# Patient Record
Sex: Female | Born: 1995 | Hispanic: Yes | Marital: Married | Smoking: Never smoker
Health system: Southern US, Community
[De-identification: ages and names within clinical notes are randomized; demographics above are authoritative.]

## PROBLEM LIST (undated history)

## (undated) DIAGNOSIS — A6000 Herpesviral infection of urogenital system, unspecified: Secondary | ICD-10-CM

## (undated) DIAGNOSIS — G43909 Migraine, unspecified, not intractable, without status migrainosus: Secondary | ICD-10-CM

## (undated) DIAGNOSIS — E282 Polycystic ovarian syndrome: Secondary | ICD-10-CM

## (undated) DIAGNOSIS — B009 Herpesviral infection, unspecified: Secondary | ICD-10-CM

## (undated) DIAGNOSIS — R945 Abnormal results of liver function studies: Secondary | ICD-10-CM

## (undated) DIAGNOSIS — A749 Chlamydial infection, unspecified: Secondary | ICD-10-CM

## (undated) HISTORY — DX: Migraine, unspecified, not intractable, without status migrainosus: G43.909

## (undated) HISTORY — DX: Herpesviral infection of urogenital system, unspecified: A60.00

## (undated) HISTORY — DX: Abnormal results of liver function studies: R94.5

## (undated) HISTORY — DX: Chlamydial infection, unspecified: A74.9

## (undated) HISTORY — DX: Herpesviral infection, unspecified: B00.9

## (undated) HISTORY — DX: Polycystic ovarian syndrome: E28.2

---

## 2015-01-28 ENCOUNTER — Emergency Department (HOSPITAL_BASED_OUTPATIENT_CLINIC_OR_DEPARTMENT_OTHER): Payer: No Typology Code available for payment source

## 2015-01-28 ENCOUNTER — Encounter (HOSPITAL_BASED_OUTPATIENT_CLINIC_OR_DEPARTMENT_OTHER): Payer: Self-pay | Admitting: *Deleted

## 2015-01-28 ENCOUNTER — Emergency Department (HOSPITAL_BASED_OUTPATIENT_CLINIC_OR_DEPARTMENT_OTHER)
Admission: EM | Admit: 2015-01-28 | Discharge: 2015-01-28 | Disposition: A | Discharge status: Home / Self Care | Payer: No Typology Code available for payment source | Attending: Emergency Medicine | Admitting: Emergency Medicine

## 2015-01-28 DIAGNOSIS — S134XXA Sprain of ligaments of cervical spine, initial encounter: Secondary | ICD-10-CM | POA: Insufficient documentation

## 2015-01-28 DIAGNOSIS — Y92410 Unspecified street and highway as the place of occurrence of the external cause: Secondary | ICD-10-CM | POA: Insufficient documentation

## 2015-01-28 DIAGNOSIS — Y9389 Activity, other specified: Secondary | ICD-10-CM | POA: Insufficient documentation

## 2015-01-28 DIAGNOSIS — Y998 Other external cause status: Secondary | ICD-10-CM | POA: Insufficient documentation

## 2015-01-28 DIAGNOSIS — S199XXA Unspecified injury of neck, initial encounter: Secondary | ICD-10-CM | POA: Diagnosis present

## 2015-01-28 MED ORDER — IBUPROFEN 400 MG PO TABS
600.0000 mg | ORAL_TABLET | Freq: Once | ORAL | Status: AC
  Administered 2015-01-28: 600 mg via ORAL
  Filled 2015-01-28: qty 1

## 2015-01-28 MED ORDER — CYCLOBENZAPRINE HCL 10 MG PO TABS: 5.0000 mg | ORAL_TABLET | Freq: Two times a day (BID) | ORAL | Status: DC | PRN

## 2015-01-28 MED ORDER — IBUPROFEN 400 MG PO TABS: 400.0000 mg | ORAL_TABLET | Freq: Four times a day (QID) | ORAL | Status: DC | PRN
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
## 2015-01-28 NOTE — ED Notes (Signed)
Patient transported to X-ray 
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
## 2015-01-28 NOTE — Discharge Instructions (Signed)
Distensión cervical °(Cervical Sprain) °Una distensión cervical es una lesión en el cuello, en la que los tejidos fuertes y fibrosos (ligamentos) que unen los huesos del cuello, se distienden o se rompen. Una distensión cervical puede ser desde muy leve a muy grave. En los casos graves pueden hacer que las vértebras del cuello se vuelvan inestables. Esto puede causar un daño en la médula espinal y puede dar lugar a graves problemas del sistema nervioso. La cantidad de tiempo que demora la mejoría de una distensión cervical depende de la causa y de la extensión de la lesión. La mayoría de las veces se cura en 1 a 3 semanas. °CAUSAS  °Las distensiones graves pueden ser causadas por:  °· Lesiones por deportes de contacto (como en el fútbol americano, rugby, lucha, hockey, automovilismo, gimnasia, buceo, artes marciales y boxeo). °· Colisiones en vehículos de motor. °· Lesiones de latigazo cervical. Esta es una lesión por movimiento brusco de adelante hacia atrás de la cabeza y el cuello. °· Caídas. °La causa de las distensiones cervicales leves pueden ser:  °· Adoptar posiciones incómodas, como sostener el teléfono entre la oreja y el hombro. °· Sentarse en una silla que no ofrece el soporte adecuado. °· Trabajar en una mesa de computadora mal diseñada. °· Las actividades que requieren mirar hacia arriba o hacia abajo durante largos períodos. °SÍNTOMAS  °· Dolor, sensibilidad, rigidez, o sensación de ardor en la parte anterior, posterior o lateral del cuello. Este malestar puede aparecer inmediatamente después de la lesión o puede desarrollarse lentamente y no empezar hasta 24 horas o más después de la lesión. °· Dolor o sensibilidad que se siente directamente en la parte media posterior del cuello. °· Dolor en el hombro o la zona superior de la espalda. °· Capacidad limitada para mover el cuello. °· Dolor de cabeza. °· Mareos. °· Debilidad, entumecimiento u hormigueo en las manos o los brazos. °· Espasmos  musculares. °· Dificultades para tragar o comer. °· Sensibilidad e hinchazón en el cuello. °DIAGNÓSTICO  °La mayoría de las veces, el médico puede diagnosticar este problema mediante la historia clínica y un examen físico. Su médico le preguntará acerca de lesiones previas y problemas conocidos como artritis en el cuello. Podrán tomarle radiografías para determinar si hay otros problemas, como enfermedades en los huesos del cuello. También puede ser necesario realizar otras pruebas, como tomografías computadas o resonancia magnética.  °TRATAMIENTO  °El tratamiento depende de la gravedad de la distensión. Las distensiones leves se pueden tratar con reposo, manteniendo el cuello en su lugar (inmovilización) y usando medicamentos para el dolor. Las distensiones graves deben ser inmediatamente inmovilizadas. Será necesario completar el tratamiento para aliviar el dolor, los espasmos musculares y otros síntomas, y puede incluir. °· Medicamentos como calmantes para el dolor, anestésicos o relajantes musculares. °· Fisioterapia. Esto puede incluir ejercicios de elongación, fortalecimiento y entrenamiento de la postura. Los ejercicios y una mejor postura pueden ayudar a estabilizar el cuello, fortalecer los músculos y evitar que los síntomas vuelvan a aparecer. °INSTRUCCIONES PARA EL CUIDADO EN EL HOGAR  °· Aplique hielo sobre la zona lesionada. °¨ Ponga el hielo en una bolsa plástica. °¨ Colóquese una toalla entre la piel y la bolsa de hielo. °¨ Deje el hielo durante 15 - 20 minutos y aplíquelo 3 - 4 veces por día. °· Si la lesión fue grave, le indicarán el uso de un collarín cervical. El collarín cervical es un collar de dos piezas para impedir que el cuello se mueva mientras se cura. °¨   Nose quite el collarín excepto que se lo indique su médico. °¨ Si tiene el cabello largo, manténgalo fuera del collarín. °¨ Consulte a su médico antes de hacerle ajustes. Los ajustes menores pueden ser requeridos con el tiempo para  mejorar el confort y reducir la presión sobre la barbilla o en la parte posterior de la cabeza. °¨ Si le permiten quitarse el collarín para lavarlo o darse un baño, siga las indicaciones de su médico acerca de cómo hacerlo con seguridad. °¨ Mantenga el collarín limpio pasando un paño con agua y jabón y secándolo bien. Si el collarín tiene almohadillas removibles, quítelas cada 1-2 días para lavarlas a mano con agua y jabón. Deje que se sequen al aire. Debe secarlas bien antes de volver a colocarlas en el collarín. °¨ Si le permiten quitarse el collarín para lavarlo y darse un baño, lave y seque la piel del cuello. Controle su piel para detectar irritación o llagas. Si las tiene, informe a su médico. °¨ No conduzca vehículos mientras usa el collarín. °· Sólo tome medicamentos de venta libre o recetados para calmar el dolor, el malestar o bajar la fiebre, según las indicaciones de su médico. °· Cumpla con todas las visitas de control, según le indique su médico. °· Cumpla con todas las sesiones de fisioterapia, según le indique su médico. °· Haga los ajustes necesarios en su lugar de trabajo para favorecer una buena postura. °· Evite las posiciones y actividades que empeoran los síntomas. °· Haga precalentamiento y elongue antes de comenzar una actividad para evitar problemas. °SOLICITE ATENCIÓN MÉDICA SI:  °· El dolor no se alivia con los medicamentos. °· No puede disminuir la dosis de analgésicos según lo planificado. °· Su nivel de actividad no mejora según lo esperado. °SOLICITE ATENCIÓN MÉDICA DE INMEDIATO SI:  °· Presenta cualquier hemorragia. °· Siente malestar estomacal. °· Tiene signos de reacción alérgica a los medicamentos. °· Los síntomas empeoran. °· Le aparecen síntomas nuevos e inexplicables. °· Siente adormecimiento, hormigueo, debilidad o parálisis en alguna parte del cuerpo. °ASEGÚRESE DE QUE:  °· Comprende estas instrucciones. °· Controlará su afección. °· Recibirá ayuda de inmediato si no mejora o  si empeora. °  °Esta información no tiene como fin reemplazar el consejo del médico. Asegúrese de hacerle al médico cualquier pregunta que tenga. °  °Document Released: 05/11/2008 Document Revised: 12/03/2012 °Elsevier Interactive Patient Education ©2016 Elsevier Inc. ° °

## 2016-07-27 DIAGNOSIS — A749 Chlamydial infection, unspecified: Secondary | ICD-10-CM

## 2016-07-27 HISTORY — DX: Chlamydial infection, unspecified: A74.9

## 2016-08-24 ENCOUNTER — Encounter: Payer: Self-pay | Admitting: Obstetrics and Gynecology

## 2016-08-24 ENCOUNTER — Ambulatory Visit (INDEPENDENT_AMBULATORY_CARE_PROVIDER_SITE_OTHER): Payer: Self-pay | Admitting: Obstetrics and Gynecology

## 2016-08-24 ENCOUNTER — Encounter: Payer: Self-pay | Admitting: Nurse Practitioner

## 2016-08-24 VITALS — BP 110/62 | HR 64 | Resp 16 | Ht 61.5 in | Wt 125.0 lb

## 2016-08-24 DIAGNOSIS — Z113 Encounter for screening for infections with a predominantly sexual mode of transmission: Secondary | ICD-10-CM

## 2016-08-24 DIAGNOSIS — N926 Irregular menstruation, unspecified: Secondary | ICD-10-CM

## 2016-08-24 LAB — POCT URINE PREGNANCY: Preg Test, Ur: NEGATIVE

## 2016-08-24 MED ORDER — NORETHINDRONE ACET-ETHINYL EST 1.5-30 MG-MCG PO TABS: 1.0000 | ORAL_TABLET | Freq: Every day | ORAL | 3 refills | Status: DC

## 2016-08-24 MED ORDER — MEDROXYPROGESTERONE ACETATE 10 MG PO TABS: 10.0000 mg | ORAL_TABLET | Freq: Every day | ORAL | 0 refills | Status: DC

## 2016-08-24 NOTE — Patient Instructions (Signed)
Ethinyl Estradiol; Norethindrone Acetate tablets (contraception) What is this medicine? ETHINYL ESTRADIOL; NORETHINDRONE ACETATE (ETH in il es tra DYE ole; nor eth IN drone AS e tate) is an oral contraceptive. The products combine two types of female hormones, an estrogen and a progestin. They are used to prevent ovulation and pregnancy. This medicine may be used for other purposes; ask your health care provider or pharmacist if you have questions. COMMON BRAND NAME(S): Gildess, Junel 1.5/30, Junel 1/20, LARIN, Loestrin 1.5/30, Loestrin 1/20, Microgestin 1.5/30, Microgestin 1/20 What should I tell my health care provider before I take this medicine? They need to know if you have or ever had any of these conditions: -abnormal vaginal bleeding -blood vessel disease or blood clots -breast, cervical, endometrial, ovarian, liver, or uterine cancer -diabetes -gallbladder disease -heart disease or recent heart attack -high blood pressure -high cholesterol -kidney disease -liver disease -migraine headaches -stroke -systemic lupus erythematosus (SLE) -tobacco smoker -an unusual or allergic reaction to estrogens, progestins, other medicines, foods, dyes, or preservatives -pregnant or trying to get pregnant -breast-feeding How should I use this medicine? Take this medicine by mouth. To reduce nausea, this medicine may be taken with food. Follow the directions on the prescription label. Take this medicine at the same time each day and in the order directed on the package. Do not take your medicine more often than directed. Contact your pediatrician regarding the use of this medicine in children. Special care may be needed. This medicine has been used in female children who have started having menstrual periods. A patient package insert for the product will be given with each prescription and refill. Read this sheet carefully each time. The sheet may change frequently. Overdosage: If you think you  have taken too much of this medicine contact a poison control center or emergency room at once. NOTE: This medicine is only for you. Do not share this medicine with others. What if I miss a dose? If you miss a dose, refer to the patient information sheet you received with your medicine for direction. If you miss more than one pill, this medicine may not be as effective and you may need to use another form of birth control. What may interact with this medicine? Do not take this medicine with the following medication: -dasabuvir; ombitasvir; paritaprevir; ritonavir -ombitasvir; paritaprevir; ritonavir This medicine may also interact with the following medications: -acetaminophen -antibiotics or medicines for infections, especially rifampin, rifabutin, rifapentine, and griseofulvin, and possibly penicillins or tetracyclines -aprepitant -ascorbic acid (vitamin C) -atorvastatin -barbiturate medicines, such as phenobarbital -bosentan -carbamazepine -caffeine -clofibrate -cyclosporine -dantrolene -doxercalciferol -felbamate -grapefruit juice -hydrocortisone -medicines for anxiety or sleeping problems, such as diazepam or temazepam -medicines for diabetes, including pioglitazone -mineral oil -modafinil -mycophenolate -nefazodone -oxcarbazepine -phenytoin -prednisolone -ritonavir or other medicines for HIV infection or AIDS -rosuvastatin -selegiline -soy isoflavones supplements -St. John's wort -tamoxifen or raloxifene -theophylline -thyroid hormones -topiramate -warfarin This list may not describe all possible interactions. Give your health care provider a list of all the medicines, herbs, non-prescription drugs, or dietary supplements you use. Also tell them if you smoke, drink alcohol, or use illegal drugs. Some items may interact with your medicine. What should I watch for while using this medicine? Visit your doctor or health care professional for regular checks on your  progress. You will need a regular breast and pelvic exam and Pap smear while on this medicine. Use an additional method of contraception during the first cycle that you take these tablets. If you have any   reason to think you are pregnant, stop taking this medicine right away and contact your doctor or health care professional. If you are taking this medicine for hormone related problems, it may take several cycles of use to see improvement in your condition. Smoking increases the risk of getting a blood clot or having a stroke while you are taking birth control pills, especially if you are more than 21 years old. You are strongly advised not to smoke. This medicine can make your body retain fluid, making your fingers, hands, or ankles swell. Your blood pressure can go up. Contact your doctor or health care professional if you feel you are retaining fluid. This medicine can make you more sensitive to the sun. Keep out of the sun. If you cannot avoid being in the sun, wear protective clothing and use sunscreen. Do not use sun lamps or tanning beds/booths. If you wear contact lenses and notice visual changes, or if the lenses begin to feel uncomfortable, consult your eye care specialist. In some women, tenderness, swelling, or minor bleeding of the gums may occur. Notify your dentist if this happens. Brushing and flossing your teeth regularly may help limit this. See your dentist regularly and inform your dentist of the medicines you are taking. If you are going to have elective surgery, you may need to stop taking this medicine before the surgery. Consult your health care professional for advice. This medicine does not protect you against HIV infection (AIDS) or any other sexually transmitted diseases. What side effects may I notice from receiving this medicine? Side effects that you should report to your doctor or health care professional as soon as possible: -breast tissue changes or discharge -changes  in vaginal bleeding during your period or between your periods -chest pain -coughing up blood -dizziness or fainting spells -headaches or migraines -leg, arm or groin pain -severe or sudden headaches -stomach pain (severe) -sudden shortness of breath -sudden loss of coordination, especially on one side of the body -speech problems -symptoms of vaginal infection like itching, irritation or unusual discharge -tenderness in the upper abdomen -vomiting -weakness or numbness in the arms or legs, especially on one side of the body -yellowing of the eyes or skin Side effects that usually do not require medical attention (report to your doctor or health care professional if they continue or are bothersome): -breakthrough bleeding and spotting that continues beyond the 3 initial cycles of pills -breast tenderness -mood changes, anxiety, depression, frustration, anger, or emotional outbursts -increased sensitivity to sun or ultraviolet light -nausea -skin rash, acne, or brown spots on the skin -weight gain (slight) This list may not describe all possible side effects. Call your doctor for medical advice about side effects. You may report side effects to FDA at 1-800-FDA-1088. Where should I keep my medicine? Keep out of the reach of children. Store at room temperature between 15 and 30 degrees C (59 and 86 degrees F). Throw away any unused medicine after the expiration date. NOTE: This sheet is a summary. It may not cover all possible information. If you have questions about this medicine, talk to your doctor, pharmacist, or health care provider.  2018 Elsevier/Gold Standard (2015-10-24 08:02:50)  

## 2016-08-24 NOTE — Progress Notes (Signed)
GYNECOLOGY  VISIT   HPI: 21 y.o.   Single  Hispanic  female   [id number] with Patient's last menstrual period was 08/03/2016.   here for   Irregular cycles -- period has lasted from 08/03/16 until now.  UPT: neg   Menses always irregular.  Menarche age 69 or 55.  Can skip two months at at time.  Can also get 2 cycles per month.  Once every 6 months.  Had 2 cycles in March.   Usually pad change every 2 - 3 hours.  Has low back pain prior to menses onset.  This time had lower abdominal cramping.  Can take Tylenol for pain if needed.  Patient is concerned about fibroids and fertility.   No increased hair growth.  No nipple discharge.   Headaches not often.  Occur once per week.  No migraine headache.   No new partner partner.  Never had STD testing.   Works as Sales executive.   GYNECOLOGIC HISTORY: Patient's last menstrual period was 08/03/2016. Contraception:  Condoms (most of the time) Menopausal hormone therapy:  n/a Last mammogram:  N/a Last pap smear:   n/a        OB History    Gravida Para Term Preterm AB Living   0 0 0 0 0 0   SAB TAB Ectopic Multiple Live Births   0 0 0 0 0         There are no active problems to display for this patient.   History reviewed. No pertinent past medical history.  History reviewed. No pertinent surgical history.  Current Outpatient Prescriptions  Medication Sig Dispense Refill  . ibuprofen (ADVIL,MOTRIN) 400 MG tablet Take 1 tablet (400 mg total) by mouth every 6 (six) hours as needed. 30 tablet 0  . Multiple Vitamins-Minerals (MULTIVITAMIN ADULT PO) Take by mouth daily.     No current facility-administered medications for this visit.      ALLERGIES: Patient has no known allergies.  Family History  Problem Relation Age of Onset  . Anemia Mother   . Thyroid disease Mother   . Diabetes Maternal Grandmother     Social History   Social History  . Marital status: Single    Spouse name: N/A  . Number of children:  N/A  . Years of education: N/A   Occupational History  . Not on file.   Social History Main Topics  . Smoking status: Never Smoker  . Smokeless tobacco: Never Used  . Alcohol use No  . Drug use: No  . Sexual activity: Yes    Birth control/ protection: Condom   Other Topics Concern  . Not on file   Social History Narrative  . No narrative on file    ROS:  Pertinent items are noted in HPI.  PHYSICAL EXAMINATION:    BP 110/62 (BP Location: Right Arm, Patient Position: Sitting, Cuff Size: Normal)   Pulse 64   Resp 16   Ht 5' 1.5" (1.562 m)   Wt 125 lb (56.7 kg)   LMP 08/03/2016   BMI 23.24 kg/m     General appearance: alert, cooperative and appears stated age Head: Normocephalic, without obvious abnormality, atraumatic Neck: no adenopathy, supple, symmetrical, trachea midline and thyroid normal to inspection and palpation Lungs: clear to auscultation bilaterally Breasts: normal appearance, no masses or tenderness, No nipple retraction or dimpling, No nipple discharge or bleeding, No axillary or supraclavicular adenopathy Heart: regular rate and rhythm Abdomen: soft, non-tender, no masses,  no organomegaly Extremities:  extremities normal, atraumatic, no cyanosis or edema Skin: Skin color, texture, turgor normal. No rashes or lesions Lymph nodes: Cervical, supraclavicular, and axillary nodes normal. No abnormal inguinal nodes palpated Neurologic: Grossly normal  Pelvic: External genitalia:  no lesions              Urethra:  normal appearing urethra with no masses, tenderness or lesions              Bartholins and Skenes: normal                 Vagina: normal appearing vagina with normal color and discharge, no lesions              Cervix: no lesions.  Menstrual blood.                 Bimanual Exam:  Uterus:  normal size, contour, position, consistency, mobility, non-tender              Adnexa: no mass, fullness, tenderness               Chaperone was present for  exam.  ASSESSMENT  Irregular menses.  STD screening.    PLAN  Discussed irregular menses.  TSH, prolactin, CBC.  Provera 10 mg x 10 days.  Discussed BC options.  Start LoEstrin 1.5/30 after withdrawal bleed. 4 packs. Instructed in use.  Discussed warning signs and risk of DVT, PE, stroke, and MI. Follow up in 3 months.     An After Visit Summary was printed and given to the patient.  ___45___ minutes face to face time of which over 50% was spent in counseling.

## 2016-08-25 LAB — HEP, RPR, HIV PANEL
HIV SCREEN 4TH GENERATION: NONREACTIVE
Hepatitis B Surface Ag: NEGATIVE
RPR: NONREACTIVE

## 2016-08-25 LAB — CBC
HEMATOCRIT: 35.8 % (ref 34.0–46.6)
Hemoglobin: 12.3 g/dL (ref 11.1–15.9)
MCH: 30.4 pg (ref 26.6–33.0)
MCHC: 34.4 g/dL (ref 31.5–35.7)
MCV: 89 fL (ref 79–97)
Platelets: 258 10*3/uL (ref 150–379)
RBC: 4.04 x10E6/uL (ref 3.77–5.28)
RDW: 13 % (ref 12.3–15.4)
WBC: 5.3 10*3/uL (ref 3.4–10.8)

## 2016-08-25 LAB — PROLACTIN: Prolactin: 11.4 ng/mL (ref 4.8–23.3)

## 2016-08-25 LAB — TSH: TSH: 2.62 u[IU]/mL (ref 0.450–4.500)

## 2016-08-25 LAB — HEPATITIS C ANTIBODY: Hep C Virus Ab: <0.1 s/co ratio (ref 0.0–0.9)

## 2016-08-26 LAB — GC/CHLAMYDIA PROBE AMP
Chlamydia trachomatis, NAA: POSITIVE — AB
NEISSERIA GONORRHOEAE BY PCR: NEGATIVE

## 2016-08-26 LAB — VAGINITIS/VAGINOSIS, DNA PROBE
Candida Species: NEGATIVE
Gardnerella vaginalis: POSITIVE — AB
Trichomonas vaginosis: NEGATIVE

## 2016-08-28 ENCOUNTER — Encounter: Payer: Self-pay | Admitting: Obstetrics and Gynecology

## 2016-08-28 ENCOUNTER — Telehealth: Payer: Self-pay

## 2016-08-28 MED ORDER — METRONIDAZOLE 500 MG PO TABS: 500.0000 mg | ORAL_TABLET | Freq: Two times a day (BID) | ORAL | 0 refills | Status: DC

## 2016-08-28 MED ORDER — AZITHROMYCIN 1 G PO PACK: 1.0000 | PACK | Freq: Once | ORAL | 0 refills | Status: AC

## 2016-08-28 NOTE — Telephone Encounter (Signed)
Left message to call Leopoldo Mazzie at [phone]. 

## 2016-08-28 NOTE — Telephone Encounter (Signed)
-----   Message from Patton SallesBrook E Amundson C Silva, MD sent at 08/28/2016  3:45 PM EDT ----- Please contact patient with results She has chlamydia.  I am recommending Azithromycin 1 gram orally.  Any partner needs to be treated as well.  Please report this to the health department.  She will need retesting with me in 3 months.    Please inform of Affirm result showing bacterial vaginosis. She may treat with Flagyl 500 mg po bid for 7 days or Metrogel pv at hs for 5 nights.  Please send Rx to pharmacy of choice. ETOH precautions.   Her testing is negative for HIV, syphilis, hepatitis B and C, trichomonas, and gonorrhea.   Her prolactin, thyroid and blood counts are all normal.

## 2016-08-28 NOTE — Telephone Encounter (Signed)
Spoke with patient. Advised of message as seen below from Dr.Silva. Patient verbalizes understanding. Would like to start Flagyl. Rx for Flagyl 500 mg BID x 7 days #14 0RF sent to pharmacy on file. Avoid alcohol during treatment and 24 hours after completing medication. Don't mix with alcohol if mixed can cause severe nausea, vomiting and abdominal cramping. Patient verbalizes understanding. Advised of positive chlamydia results. Aware she will need to abstain from intercourse until she and her partner have received treatment and for 1 week following. Will need to use condoms for protection against STD's with intercourse. Patient verbalizes understanding. Zithromax 1 gram take 1 packet once #1 0RF sent to pharmacy on file. Health department confidential communicable disease report completed and [facility] at [phone]. 3 month recheck is scheduled for 11/23/2016 at 8:15 am with Dr.Silva.  Routing to provider for final review. Patient agreeable to disposition. Will close encounter.

## 2016-08-28 NOTE — Telephone Encounter (Signed)
Return call to Kaitlyn. °

## 2016-08-30 ENCOUNTER — Telehealth: Payer: Self-pay | Admitting: Obstetrics and Gynecology

## 2016-08-30 NOTE — Telephone Encounter (Signed)
Patient went to pick her prescription that was supposed to be in powder form, but received tablets. Patient is asking if this is okay? Patient also asked "where can her partner get treatment?'

## 2016-08-30 NOTE — Telephone Encounter (Signed)
Spoke with patient. Advised patient it is okay to take 2 zithromycin 500 mg tablets. Patient states she already took both. Advised this is the same dosage and medication as the powder. Patient is agreeable. Advised partner can seek evaluation with PCP or Health Department in [place] or [place] [phone]. Patient is agreeable and aware she will need to abstain from intercourse until she and her partner are treated and for 1 week after. Will need to use condoms following.  Routing to provider for final review. Patient agreeable to disposition. Will close encounter.

## 2016-09-14 ENCOUNTER — Telehealth: Payer: Self-pay | Admitting: Obstetrics and Gynecology

## 2016-09-14 NOTE — Telephone Encounter (Signed)
Patient says she is getting headaches and is not sure if they could be coming from her birth control pills.

## 2016-09-14 NOTE — Telephone Encounter (Signed)
Left message to call Obadiah Dennard at [phone].  

## 2016-10-05 NOTE — Telephone Encounter (Signed)
Keep appointment for recheck in September.  If headaches return, come in for appointment sooner.

## 2016-10-05 NOTE — Telephone Encounter (Signed)
Left message to call Jeanise Durfey at [phone].  

## 2016-10-05 NOTE — Telephone Encounter (Signed)
Spoke with patient. States she recently started OCP, noticed increase in headaches initially -denies visual changes, N/V, sensitivity. Patient states headaches have resolved since initial call, no headaches in last few weeks. Patient states she felt her body needed to adjust to medication.   Advised patient headache can be a side effect of OCP. Patient aware to return call to office if HA become more frequent/intense, aura develops. Keep f/u appointment as scheduled.   Advised patient would review with Dr. Edward JollySilva and return call with additional recommendations, patient verbalizes understanding and is agreeable.   Dr. Edward JollySilva -any additional recommendations?

## 2016-10-08 NOTE — Telephone Encounter (Signed)
Left detailed message, ok per current dpr. Advised as seen below per Dr. Edward JollySilva. Advised to return call to office with any additional questions.  Patient is agreeable to disposition. Will close encounter.

## 2016-11-07 ENCOUNTER — Ambulatory Visit: Payer: Self-pay | Admitting: Obstetrics and Gynecology

## 2016-11-08 NOTE — Progress Notes (Signed)
GYNECOLOGY  VISIT   HPI: 21 y.o.   Single  Hispanic  female   G0P0000 with Patient's last menstrual period was 10/29/2016 (exact date).   here for 3 month follow up. Patient states menses have been regular since beginning Loestrin.  Menses are regular. Bleeding is shorter and medium to light flow.  Some back pain with her cycles, which is usual for her.   Having increased headaches, however.  These are mild in nature.  Takes Advil for them, and this works well. These are random and occur 3 times per week. This is new for her.   She will see the oral surgeon to see if her wisdom teeth are also coming in sideways. Not sure if this is the cause of some of her headaches.   Works at a [employer] with kids.   GYNECOLOGIC HISTORY: Patient's last menstrual period was 10/29/2016 (exact date). Contraception: LoEstrin 1.5/30 Menopausal hormone therapy:  none Last mammogram: n/a Last pap smear:   n/a        OB History    Gravida Para Term Preterm AB Living   0 0 0 0 0 0   SAB TAB Ectopic Multiple Live Births   0 0 0 0 0         Patient Active Problem List   Diagnosis Date Noted  . Irregular menses 08/24/2016    Past Medical History:  Diagnosis Date  . Chlamydia 07/2016    No past surgical history on file.  Current Outpatient Prescriptions  Medication Sig Dispense Refill  . Multiple Vitamins-Minerals (MULTIVITAMIN ADULT PO) Take by mouth daily.    . Norethindrone Acetate-Ethinyl Estradiol (LOESTRIN 1.5/30, 21,) 1.5-30 MG-MCG tablet Take 1 tablet by mouth daily. 1 Package 3   No current facility-administered medications for this visit.      ALLERGIES: Patient has no known allergies.  Family History  Problem Relation Age of Onset  . Anemia Mother   . Thyroid disease Mother   . Diabetes Maternal Grandmother     Social History   Social History  . Marital status: Single    Spouse name: N/A  . Number of children: N/A  . Years of education: N/A   Occupational  History  . Not on file.   Social History Main Topics  . Smoking status: Never Smoker  . Smokeless tobacco: Never Used  . Alcohol use No  . Drug use: No  . Sexual activity: Yes    Birth control/ protection: Condom, Pill     Comment: Junel    Other Topics Concern  . Not on file   Social History Narrative  . No narrative on file    ROS:  Pertinent items are noted in HPI.  PHYSICAL EXAMINATION:    BP (!) 100/58 (BP Location: Right Arm, Patient Position: Sitting, Cuff Size: Normal)   Pulse 76   Wt 125 lb (56.7 kg)   LMP 10/29/2016 (Exact Date)   BMI 23.24 kg/m     General appearance: alert, cooperative and appears stated age   ASSESSMENT  Hx irregular menses.  Headaches on combined OCP.  Wisdom teeth problems.  PLAN  We discussed options of lowering COC dose, Micronor, progesterone IUDs. She wants to try Loestrin 1/20.  Refills for 9 more months.   An After Visit Summary was printed and given to the patient. __15______ minutes face to face time of which over 50% was spent in counseling.

## 2016-11-14 ENCOUNTER — Ambulatory Visit (INDEPENDENT_AMBULATORY_CARE_PROVIDER_SITE_OTHER): Payer: Managed Care, Other (non HMO) | Admitting: Obstetrics and Gynecology

## 2016-11-14 ENCOUNTER — Encounter: Payer: Self-pay | Admitting: Obstetrics and Gynecology

## 2016-11-14 VITALS — BP 100/58 | HR 76 | Wt 125.0 lb

## 2016-11-14 DIAGNOSIS — Z3041 Encounter for surveillance of contraceptive pills: Secondary | ICD-10-CM

## 2016-11-14 MED ORDER — NORETHIN ACE-ETH ESTRAD-FE 1-20 MG-MCG PO TABS: 1.0000 | ORAL_TABLET | Freq: Every day | ORAL | 2 refills | Status: DC

## 2016-11-23 ENCOUNTER — Ambulatory Visit: Payer: Self-pay | Admitting: Obstetrics and Gynecology

## 2016-12-17 ENCOUNTER — Other Ambulatory Visit: Payer: Self-pay | Admitting: Obstetrics and Gynecology

## 2017-02-11 ENCOUNTER — Telehealth: Payer: Self-pay | Admitting: Obstetrics and Gynecology

## 2017-02-11 NOTE — Telephone Encounter (Signed)
Spoke with patient and she states regular cycles with Loestrin 1/20 until October. She states LMP 12-24-16 and hasn't had a cycle since. Hasn't missed any pills or taken any late. Made appointment to see Leota Sauerseborah Leonard, CNM on 02-20-17. Patient states had taken 2 UPTs which have been negative.

## 2017-02-11 NOTE — Telephone Encounter (Signed)
Patient states she missed her period last month.  Took a pregnancy test and was negative.  Next cycle due to start on Sunday 02/17/17.

## 2017-02-11 NOTE — Telephone Encounter (Signed)
Left message to call Kristan Votta at [phone].  

## 2017-02-20 ENCOUNTER — Ambulatory Visit: Payer: Managed Care, Other (non HMO) | Admitting: Certified Nurse Midwife

## 2017-02-20 ENCOUNTER — Encounter: Payer: Self-pay | Admitting: Certified Nurse Midwife

## 2017-02-20 ENCOUNTER — Other Ambulatory Visit: Payer: Self-pay

## 2017-02-20 VITALS — BP 100/64 | HR 68 | Resp 16 | Ht 61.5 in | Wt 126.0 lb

## 2017-02-20 DIAGNOSIS — N912 Amenorrhea, unspecified: Secondary | ICD-10-CM

## 2017-02-20 DIAGNOSIS — Z01419 Encounter for gynecological examination (general) (routine) without abnormal findings: Secondary | ICD-10-CM

## 2017-02-20 DIAGNOSIS — Z3041 Encounter for surveillance of contraceptive pills: Secondary | ICD-10-CM | POA: Diagnosis not present

## 2017-02-20 DIAGNOSIS — Z3202 Encounter for pregnancy test, result negative: Secondary | ICD-10-CM | POA: Diagnosis not present

## 2017-02-20 LAB — POCT URINE PREGNANCY: Preg Test, Ur: NEGATIVE

## 2017-02-20 NOTE — Patient Instructions (Signed)
Oral Contraception Use Oral contraceptive pills (OCPs) are medicines taken to prevent pregnancy. OCPs work by preventing the ovaries from releasing eggs. The hormones in OCPs also cause the cervical mucus to thicken, preventing the sperm from entering the uterus. The hormones also cause the uterine lining to become thin, not allowing a fertilized egg to attach to the inside of the uterus. OCPs are highly effective when taken exactly as prescribed. However, OCPs do not prevent sexually transmitted diseases (STDs). Safe sex practices, such as using condoms along with an OCP, can help prevent STDs. Before taking OCPs, you may have a physical exam and Pap test. Your health care provider may also order blood tests if necessary. Your health care provider will make sure you are a good candidate for oral contraception. Discuss with your health care provider the possible side effects of the OCP you may be prescribed. When starting an OCP, it can take 2 to 3 months for the body to adjust to the changes in hormone levels in your body. How to take oral contraceptive pills Your health care provider may advise you on how to start taking the first cycle of OCPs. Otherwise, you can:  Start on day 1 of your menstrual period. You will not need any backup contraceptive protection with this start time.  Start on the first Sunday after your menstrual period or the day you get your prescription. In these cases, you will need to use backup contraceptive protection for the first week.  Start the pill at any time of your cycle. If you take the pill within 5 days of the start of your period, you are protected against pregnancy right away. In this case, you will not need a backup form of birth control. If you start at any other time of your menstrual cycle, you will need to use another form of birth control for 7 days. If your OCP is the type called a minipill, it will protect you from pregnancy after taking it for 2 days (48  hours).  After you have started taking OCPs:  If you forget to take 1 pill, take it as soon as you remember. Take the next pill at the regular time.  If you miss 2 or more pills, call your health care provider because different pills have different instructions for missed doses. Use backup birth control until your next menstrual period starts.  If you use a 28-day pack that contains inactive pills and you miss 1 of the last 7 pills (pills with no hormones), it will not matter. Throw away the rest of the non-hormone pills and start a new pill pack.  No matter which day you start the OCP, you will always start a new pack on that same day of the week. Have an extra pack of OCPs and a backup contraceptive method available in case you miss some pills or lose your OCP pack. Follow these instructions at home:  Do not smoke.  Always use a condom to protect against STDs. OCPs do not protect against STDs.  Use a calendar to mark your menstrual period days.  Read the information and directions that came with your OCP. Talk to your health care provider if you have questions. Contact a health care provider if:  You develop nausea and vomiting.  You have abnormal vaginal discharge or bleeding.  You develop a rash.  You miss your menstrual period.  You are losing your hair.  You need treatment for mood swings or depression.  You   get dizzy when taking the OCP.  You develop acne from taking the OCP.  You become pregnant. Get help right away if:  You develop chest pain.  You develop shortness of breath.  You have an uncontrolled or severe headache.  You develop numbness or slurred speech.  You develop visual problems.  You develop pain, redness, and swelling in the legs. This information is not intended to replace advice given to you by your health care provider. Make sure you discuss any questions you have with your health care provider. Document Released: 02/01/2011 Document  Revised: 07/21/2015 Document Reviewed: 08/03/2012 Elsevier Interactive Patient Education  2017 Elsevier Inc.  

## 2017-02-20 NOTE — Progress Notes (Signed)
21 y.o. Single Hispanic G0P0000 here for evaluation of OCP use with change of pills  Loestrin 1.5/30 to Loestrin 1/20 Fe on October 2018 to see if headaches would reduce. Headaches are not occurring now and she feels better on the lower dose.  Patient taking medication as prescribed. Denies missed pills, , nausea, DVT warning signs or symptoms,  breakthrough bleeding, or other changes.   Keeping menses calendar. Patient missed period in November, with negative home UPT, felt related to pill change. Now on 3 day of iron pills in pack and spotting, feels like period now. Happy with how she feels on this dosage. No partner change or STD concerns today. No other health issues today  ROS pertinent to HPI  O: Healthy female, WD WN Affect: normal orientation X 3  POCT UPT: negative  Pelvic exam: external genital : normal female, no lesions or tenderness Vagina: normal appearance with small to moderate red blood noted, menses appearance, non tender Cervix: nulliparous with blood noted from cervix, no CMT, normal appearance Uterus: normal, non tender, no masses Adnexa: normal, no masses or tenderness    A: History of headaches( no migraines) with Loestrin 1.5/30, second month of use of Loestrin 1/20 Fe with no headache occurrence UPT negative Normal pelvic exam Normal surveillance with OCP use  P:Discussed normal pelvic exam, negative UPT and not uncommon to miss period with change of dosage. Also when her cycle occurs may change in the placebo pack also related to this. Discussed bleeding consistent with period also. Questions addressed. Stressed importance of consistent use and condom use for additional protection of STD and pregnancy.  Continue  Loestrin 1/20 Fe as prescribed.   RV prn, aex

## 2017-02-26 DIAGNOSIS — R7989 Other specified abnormal findings of blood chemistry: Secondary | ICD-10-CM

## 2017-02-26 HISTORY — DX: Other specified abnormal findings of blood chemistry: R79.89

## 2017-04-01 ENCOUNTER — Telehealth: Payer: Self-pay | Admitting: Certified Nurse Midwife

## 2017-04-01 NOTE — Telephone Encounter (Signed)
Spoke with patient. Reports BTB with OCP. Reports spotting on 2/3 and 2/4.   LMP 03/18/17, started OCP on 03/24/17.   Denies missed/late pills, taking same time q day. No new partners or STD concerns.  Reports intermittent "menses like cramps" with spotting.   Denies any other gyn symptoms, N/V, fever/chills.  Advised not uncommon to experience BTB when starting OCP, can take menses 3 months to adjust. Continue to monitor, should bleeding become heavy, new symptoms develop or BTB does not resolve, return call to office. Will review with Dr. Edward JollySilva and return call with any additional recommendations. Patient verbalizes understanding and is agreeable.   Routing to provider for final review. Patient is agreeable to disposition. Will close encounter.

## 2017-04-01 NOTE — Telephone Encounter (Signed)
Patient is having abnormal bleeding. °

## 2017-04-04 ENCOUNTER — Ambulatory Visit: Payer: Managed Care, Other (non HMO) | Admitting: Certified Nurse Midwife

## 2017-05-27 HISTORY — PX: WISDOM TOOTH EXTRACTION: SHX21

## 2017-08-26 ENCOUNTER — Encounter: Payer: Self-pay | Admitting: Obstetrics and Gynecology

## 2017-08-26 ENCOUNTER — Ambulatory Visit: Payer: Managed Care, Other (non HMO) | Admitting: Obstetrics and Gynecology

## 2017-08-26 ENCOUNTER — Other Ambulatory Visit (HOSPITAL_COMMUNITY)
Admission: RE | Admit: 2017-08-26 | Discharge: 2017-08-26 | Disposition: A | Discharge status: Home / Self Care | Payer: Managed Care, Other (non HMO) | Source: Ambulatory Visit | Attending: Obstetrics and Gynecology | Admitting: Obstetrics and Gynecology

## 2017-08-26 ENCOUNTER — Other Ambulatory Visit: Payer: Self-pay

## 2017-08-26 VITALS — BP 96/52 | HR 80 | Resp 14 | Ht 61.5 in | Wt 120.0 lb

## 2017-08-26 DIAGNOSIS — R748 Abnormal levels of other serum enzymes: Secondary | ICD-10-CM | POA: Diagnosis not present

## 2017-08-26 DIAGNOSIS — Z113 Encounter for screening for infections with a predominantly sexual mode of transmission: Secondary | ICD-10-CM | POA: Diagnosis not present

## 2017-08-26 DIAGNOSIS — Z01419 Encounter for gynecological examination (general) (routine) without abnormal findings: Secondary | ICD-10-CM | POA: Diagnosis not present

## 2017-08-26 MED ORDER — NORETHIN ACE-ETH ESTRAD-FE 1-20 MG-MCG PO TABS: 1.0000 | ORAL_TABLET | Freq: Every day | ORAL | 3 refills | Status: DC

## 2017-08-26 NOTE — Progress Notes (Signed)
22 y.o. G0P0000 Single Hispanic female here for annual exam.    Now is on Loestrin 1/20.  Spotting for one day only.  HA have gone away completely on this dosage.   Wants STD testing and routine labs.   PCP:   None per patient  Patient's last menstrual period was 08/05/2017.     Period Cycle (Days): 28 Period Duration (Days): 1-2 Period Pattern: Regular Menstrual Flow: Light Menstrual Control: Panty liner Dysmenorrhea: (!) Mild Dysmenorrhea Symptoms: Other (Comment)(back pain )     Sexually active: Yes.    The current method of family planning is OCP (estrogen/progesterone).   Uses condoms some of the time.  Exercising: Yes.    cardio, weights, lifting Smoker:  no  Health Maintenance: Pap:  None  History of abnormal Pap:  n/a MMG:  n/a Colonoscopy:  n/a BMD:   n/a  Result  n/a TDaP:  UTD Gardasil:   Yes completed all 3  HIV: 08-24-16 negative Hep C: 08-24-16 negative Screening Labs:  Hb today: discuss with provider, Urine today: not collected   reports that she has never smoked. She has never used smokeless tobacco. She reports that she does not drink alcohol or use drugs.  Past Medical History:  Diagnosis Date  . Chlamydia 07/2016    Past Surgical History:  Procedure Laterality Date  . WISDOM TOOTH EXTRACTION  05/27/2017    Current Outpatient Medications  Medication Sig Dispense Refill  . Multiple Vitamins-Minerals (MULTIVITAMIN ADULT PO) Take by mouth daily.    . norethindrone-ethinyl estradiol (JUNEL FE,GILDESS FE,LOESTRIN FE) 1-20 MG-MCG tablet Take 1 tablet by mouth daily. 3 Package 3  . PREVIDENT 5000 SENSITIVE 1.1-5 % PSTE U ONCE A DAY HS  0   No current facility-administered medications for this visit.     Family History  Problem Relation Age of Onset  . Anemia Mother   . Thyroid disease Mother   . Diabetes Maternal Grandmother     Review of Systems  Constitutional: Negative.   HENT: Negative.   Eyes: Negative.   Respiratory: Negative.    Cardiovascular: Negative.   Gastrointestinal: Negative.   Endocrine: Negative.   Genitourinary: Negative.   Musculoskeletal: Negative.   Skin: Negative.   Allergic/Immunologic: Negative.   Neurological: Negative.   Hematological: Negative.   Psychiatric/Behavioral: Negative.     Exam:   BP (!) 96/52 (BP Location: Right Arm, Patient Position: Sitting, Cuff Size: Normal)   Pulse 80   Resp 14   Ht 5' 1.5" (1.562 m)   Wt 120 lb (54.4 kg)   LMP 08/05/2017   BMI 22.31 kg/m     General appearance: alert, cooperative and appears stated age Head: Normocephalic, without obvious abnormality, atraumatic Neck: no adenopathy, supple, symmetrical, trachea midline and thyroid normal to inspection and palpation Lungs: clear to auscultation bilaterally Breasts: normal appearance, no masses or tenderness, No nipple retraction or dimpling, No nipple discharge or bleeding, No axillary or supraclavicular adenopathy Heart: regular rate and rhythm Abdomen: soft, non-tender; no masses, no organomegaly Extremities: extremities normal, atraumatic, no cyanosis or edema Skin: Skin color, texture, turgor normal. No rashes or lesions Lymph nodes: Cervical, supraclavicular, and axillary nodes normal. No abnormal inguinal nodes palpated Neurologic: Grossly normal  Pelvic: External genitalia:  no lesions              Urethra:  normal appearing urethra with no masses, tenderness or lesions              Bartholins and Skenes: normal  Vagina: normal appearing vagina with normal color and discharge, no lesions              Cervix: no lesions              Pap taken: Yes.   Bimanual Exam:  Uterus:  normal size, contour, position, consistency, mobility, non-tender              Adnexa: no mass, fullness, tenderness               Chaperone was present for exam.  Assessment:   Well woman visit with normal exam. Hx chlamydia.  Plan: Mammogram screening age 22.  Recommended self breast  awareness. Pap and HR HPV as above. Guidelines for Calcium, Vitamin D, regular exercise program including cardiovascular and weight bearing exercise. STD screening. Routine labs.  Refill of OCPs for one year.  Follow up annually and prn.   After visit summary provided.

## 2017-08-26 NOTE — Patient Instructions (Signed)

## 2017-08-27 ENCOUNTER — Encounter: Payer: Self-pay | Admitting: Obstetrics and Gynecology

## 2017-08-27 LAB — COMPREHENSIVE METABOLIC PANEL
ALK PHOS: 65 IU/L (ref 39–117)
ALT: 35 IU/L — AB (ref 0–32)
AST: 56 IU/L — ABNORMAL HIGH (ref 0–40)
Albumin/Globulin Ratio: 1.6 (ref 1.2–2.2)
Albumin: 4.5 g/dL (ref 3.5–5.5)
BILIRUBIN TOTAL: 0.2 mg/dL (ref 0.0–1.2)
BUN / CREAT RATIO: 28 — AB (ref 9–23)
BUN: 17 mg/dL (ref 6–20)
CHLORIDE: 106 mmol/L (ref 96–106)
CO2: 22 mmol/L (ref 20–29)
Calcium: 9.5 mg/dL (ref 8.7–10.2)
Creatinine, Ser: 0.61 mg/dL (ref 0.57–1.00)
GFR calc Af Amer: 150 mL/min/{1.73_m2} (ref >59)
GFR calc non Af Amer: 130 mL/min/{1.73_m2} (ref >59)
GLUCOSE: 92 mg/dL (ref 65–99)
Globulin, Total: 2.8 g/dL (ref 1.5–4.5)
POTASSIUM: 4.2 mmol/L (ref 3.5–5.2)
Sodium: 142 mmol/L (ref 134–144)
Total Protein: 7.3 g/dL (ref 6.0–8.5)

## 2017-08-27 LAB — CBC
Hematocrit: 39.4 % (ref 34.0–46.6)
Hemoglobin: 12.8 g/dL (ref 11.1–15.9)
MCH: 30.3 pg (ref 26.6–33.0)
MCHC: 32.5 g/dL (ref 31.5–35.7)
MCV: 93 fL (ref 79–97)
PLATELETS: 291 10*3/uL (ref 150–450)
RBC: 4.22 x10E6/uL (ref 3.77–5.28)
RDW: 13.3 % (ref 12.3–15.4)
WBC: 6.8 10*3/uL (ref 3.4–10.8)

## 2017-08-27 LAB — CYTOLOGY - PAP
CHLAMYDIA, DNA PROBE: NEGATIVE
Diagnosis: NEGATIVE
NEISSERIA GONORRHEA: NEGATIVE
Trichomonas: NEGATIVE

## 2017-08-27 LAB — LIPID PANEL
CHOLESTEROL TOTAL: 211 mg/dL — AB (ref 100–199)
Chol/HDL Ratio: 3.6 ratio (ref 0.0–4.4)
HDL: 59 mg/dL (ref >39)
LDL CALC: 119 mg/dL — AB (ref 0–99)
TRIGLYCERIDES: 163 mg/dL — AB (ref 0–149)
VLDL CHOLESTEROL CAL: 33 mg/dL (ref 5–40)

## 2017-08-27 LAB — HEP, RPR, HIV PANEL
HIV SCREEN 4TH GENERATION: NONREACTIVE
Hepatitis B Surface Ag: NEGATIVE
RPR: NONREACTIVE

## 2017-08-27 LAB — HEPATITIS C ANTIBODY

## 2017-08-27 NOTE — Addendum Note (Signed)
Addended by: Ardell IsaacsAMUNDSON C SILVA, Debbe BalesBROOK E on: 08/27/2017 01:12 PM   Modules accepted: Orders

## 2017-09-27 ENCOUNTER — Other Ambulatory Visit: Payer: Managed Care, Other (non HMO)

## 2017-09-27 DIAGNOSIS — R748 Abnormal levels of other serum enzymes: Secondary | ICD-10-CM

## 2017-09-28 LAB — HEPATIC FUNCTION PANEL
ALK PHOS: 64 IU/L (ref 39–117)
ALT: 30 IU/L (ref 0–32)
AST: 24 IU/L (ref 0–40)
Albumin: 4.1 g/dL (ref 3.5–5.5)
BILIRUBIN TOTAL: 0.3 mg/dL (ref 0.0–1.2)
BILIRUBIN, DIRECT: 0.12 mg/dL (ref 0.00–0.40)
TOTAL PROTEIN: 7.2 g/dL (ref 6.0–8.5)

## 2017-11-19 ENCOUNTER — Encounter: Payer: Self-pay | Admitting: Certified Nurse Midwife

## 2017-11-19 ENCOUNTER — Other Ambulatory Visit: Payer: Self-pay

## 2017-11-19 ENCOUNTER — Ambulatory Visit: Payer: 59 | Admitting: Certified Nurse Midwife

## 2017-11-19 ENCOUNTER — Telehealth: Payer: Self-pay | Admitting: Obstetrics and Gynecology

## 2017-11-19 VITALS — BP 104/60 | HR 64 | Resp 16 | Wt 120.0 lb

## 2017-11-19 DIAGNOSIS — N926 Irregular menstruation, unspecified: Secondary | ICD-10-CM | POA: Diagnosis not present

## 2017-11-19 DIAGNOSIS — N898 Other specified noninflammatory disorders of vagina: Secondary | ICD-10-CM

## 2017-11-19 DIAGNOSIS — Z113 Encounter for screening for infections with a predominantly sexual mode of transmission: Secondary | ICD-10-CM | POA: Diagnosis not present

## 2017-11-19 DIAGNOSIS — Z8619 Personal history of other infectious and parasitic diseases: Secondary | ICD-10-CM

## 2017-11-19 DIAGNOSIS — Z3041 Encounter for surveillance of contraceptive pills: Secondary | ICD-10-CM | POA: Diagnosis not present

## 2017-11-19 LAB — POCT URINE PREGNANCY: Preg Test, Ur: NEGATIVE

## 2017-11-19 NOTE — Telephone Encounter (Signed)
Spoke with patient. Reports pink to red vaginal d/c after voiding. Intermittent, dull, left side pain. Started 3-4 days ago. LMP 10/31/17. On OCP for contraceptive, in 3rd wk of pack. No missed or late pills. UPT negative 11/17/17. New partner for 3 months. Denies urinary symptoms, N/V, fever/chills.   Patient request to see Dr. Edward JollySilva or Leota Sauerseborah Leonard, CNM.   OV scheduled for today at 3pm with Leota Sauerseborah Leonard, CNM.  Patient verbalizes understanding and is agreeable. Encounter closed.

## 2017-11-19 NOTE — Telephone Encounter (Signed)
Patient stated that she is on Junel FE, and has been taking it regularly. Patient stated that she started spotting about 3-4 days ago. Patient stated that it is "pinkish-reddish" when she wipes after urinating. Patient stated that she feels "weird and off." Patient stated that she has experienced some pain in her lower abdomen.

## 2017-11-19 NOTE — Telephone Encounter (Signed)
Left message to call Jaimie Redditt at [phone].  

## 2017-11-19 NOTE — Progress Notes (Signed)
22 y.o.Single Hispanic female G0P0000 with a 4 day(s) history of the following:spotting, but feels it is just rusty colored discharge, with minimal odor.. Denies vaginal itching. Sexually active:  Last sexual activity:3 days ago and this was when she noted symptoms. Pt also reports the following associated symptoms:of change in discharge. Patient has also had changed partner and feels she needs STD screening. Has been with partner 3 months. Patient also has participated in oral and hand stimulation from partner. History of Chlamydia treated with previous partner. Patient concerned. OCP working well for contraception. Condom use sometimes, but not consistent use. No other health issues today.  Review of Systems  Constitutional: Negative.   HENT: Negative.   Eyes: Negative.   Respiratory: Negative.   Cardiovascular: Negative.   Gastrointestinal: Negative for abdominal pain.  Genitourinary: Negative.        Odorous vaginal discharge  Musculoskeletal: Negative.   Skin: Negative.   Neurological: Negative.   Endo/Heme/Allergies: Negative.   Psychiatric/Behavioral: The patient is nervous/anxious.        Exam: Skin: warm and dry               Abdomen: Soft, non tender, no masses               Ext:Bartholin's, Urethra, Skene's normal, no lesions                Vaginal::no lesions,watery light rusty colored odorous discharge:                 Cx:  normal appearance and no CMT, no blood noted from cervix                Uterus:anteverted, non-tender, normal shape and consistency                Adnexa: normal adnexa and no mass, fullness, tenderness       Rectal: normal appearance, no lesions        A: Normal pelvic exam      Contraception OCP       Odorous vaginal discharge       STD screening desired       History of Chlamydia treated    P: Discussed normal pelvic exam finding.      Discussed importance of OCP use for contraception and condom use for STD protection.       Lab: Affirm,  Gc/Chlamydia,STD panel, Hep C       Will treat if indicated.  Rv prn

## 2017-11-19 NOTE — Patient Instructions (Signed)
Sexually Transmitted Disease  A sexually transmitted disease (STD) is a disease or infection that may be passed (transmitted) from person to person, usually during sexual activity. This may happen by way of saliva, semen, blood, vaginal mucus, or urine. Common STDs include:   Gonorrhea.   Chlamydia.   Syphilis.   HIV and AIDS.   Genital herpes.   Hepatitis B and C.   Trichomonas.   Human papillomavirus (HPV).   Pubic lice.   Scabies.   Mites.   Bacterial vaginosis.    What are the causes?  An STD may be caused by bacteria, a virus, or parasites. STDs are often transmitted during sexual activity if one person is infected. However, they may also be transmitted through nonsexual means. STDs may be transmitted after:   Sexual intercourse with an infected person.   Sharing sex toys with an infected person.   Sharing needles with an infected person or using unclean piercing or tattoo needles.   Having intimate contact with the genitals, mouth, or rectal areas of an infected person.   Exposure to infected fluids during birth.    What are the signs or symptoms?  Different STDs have different symptoms. Some people may not have any symptoms. If symptoms are present, they may include:   Painful or bloody urination.   Pain in the pelvis, abdomen, vagina, anus, throat, or eyes.   A skin rash, itching, or irritation.   Growths, ulcerations, blisters, or sores in the genital and anal areas.   Abnormal vaginal discharge with or without bad odor.   Penile discharge in men.   Fever.   Pain or bleeding during sexual intercourse.   Swollen glands in the groin area.   Yellow skin and eyes (jaundice). This is seen with hepatitis.   Swollen testicles.   Infertility.   Sores and blisters in the mouth.    How is this diagnosed?  To make a diagnosis, your health care provider may:   Take a medical history.   Perform a physical exam.   Take a sample of any discharge to examine.   Swab the throat, cervix,  opening to the penis, rectum, or vagina for testing.   Test a sample of your first morning urine.   Perform blood tests.   Perform a Pap test, if this applies.   Perform a colposcopy.   Perform a laparoscopy.    How is this treated?  Treatment depends on the STD. Some STDs may be treated but not cured.   Chlamydia, gonorrhea, trichomonas, and syphilis can be cured with antibiotic medicine.   Genital herpes, hepatitis, and HIV can be treated, but not cured, with prescribed medicines. The medicines lessen symptoms.   Genital warts from HPV can be treated with medicine or by freezing, burning (electrocautery), or surgery. Warts may come back.   HPV cannot be cured with medicine or surgery. However, abnormal areas may be removed from the cervix, vagina, or vulva.   If your diagnosis is confirmed, your recent sexual partners need treatment. This is true even if they are symptom-free or have a negative culture or evaluation. They should not have sex until their health care providers say it is okay.   Your health care provider may test you for infection again 3 months after treatment.    How is this prevented?  Take these steps to reduce your risk of getting an STD:   Use latex condoms, dental dams, and water-soluble lubricants during sexual activity. Do not use   petroleum jelly or oils.   Avoid having multiple sex partners.   Do not have sex with someone who has other sex partners.   Do not have sex with anyone you do not know or who is at high risk for an STD.   Avoid risky sex practices that can break your skin.   Do not have sex if you have open sores on your mouth or skin.   Avoid drinking too much alcohol or taking illegal drugs. Alcohol and drugs can affect your judgment and put you in a vulnerable position.   Avoid engaging in oral and anal sex acts.   Get vaccinated for HPV and hepatitis. If you have not received these vaccines in the past, talk to your health care provider about whether one or  both might be right for you.   If you are at risk of being infected with HIV, it is recommended that you take a prescription medicine daily to prevent HIV infection. This is called pre-exposure prophylaxis (PrEP). You are considered at risk if:  ? You are a man who has sex with other men (MSM).  ? You are a heterosexual man or woman and are sexually active with more than one partner.  ? You take drugs by injection.  ? You are sexually active with a partner who has HIV.   Talk with your health care provider about whether you are at high risk of being infected with HIV. If you choose to begin PrEP, you should first be tested for HIV. You should then be tested every 3 months for as long as you are taking PrEP.    Contact a health care provider if:   See your health care provider.   Tell your sexual partner(s). They should be tested and treated for any STDs.   Do not have sex until your health care provider says it is okay.  Get help right away if:  Contact your health care provider right away if:   You have severe abdominal pain.   You are a man and notice swelling or pain in your testicles.   You are a woman and notice swelling or pain in your vagina.    This information is not intended to replace advice given to you by your health care provider. Make sure you discuss any questions you have with your health care provider.  Document Released: 05/05/2002 Document Revised: 09/02/2015 Document Reviewed: 09/02/2012  Elsevier Interactive Patient Education  2018 [employer]

## 2017-11-20 ENCOUNTER — Other Ambulatory Visit: Payer: Self-pay

## 2017-11-20 LAB — HEP, RPR, HIV PANEL
HIV SCREEN 4TH GENERATION: NONREACTIVE
Hepatitis B Surface Ag: NEGATIVE
RPR: NONREACTIVE

## 2017-11-20 LAB — GC/CHLAMYDIA PROBE AMP
Chlamydia trachomatis, NAA: NEGATIVE
Neisseria gonorrhoeae by PCR: NEGATIVE

## 2017-11-20 LAB — VAGINITIS/VAGINOSIS, DNA PROBE
CANDIDA SPECIES: NEGATIVE
Gardnerella vaginalis: POSITIVE — AB
TRICHOMONAS VAG: NEGATIVE

## 2017-11-20 LAB — HEPATITIS C ANTIBODY

## 2017-11-20 NOTE — Telephone Encounter (Signed)
Left message to callback to give results for positive bv infection.

## 2017-11-21 MED ORDER — METRONIDAZOLE 500 MG PO TABS: 500.0000 mg | ORAL_TABLET | Freq: Two times a day (BID) | ORAL | 0 refills | Status: DC

## 2017-11-21 NOTE — Telephone Encounter (Signed)
Left message for call back.

## 2017-11-21 NOTE — Telephone Encounter (Signed)
Patient notified of results & flagyl sent to pharmacy. 

## 2018-05-11 ENCOUNTER — Ambulatory Visit (HOSPITAL_COMMUNITY)
Admission: EM | Admit: 2018-05-11 | Discharge: 2018-05-11 | Disposition: A | Discharge status: Home / Self Care | Payer: 59 | Attending: Family Medicine | Admitting: Family Medicine

## 2018-05-11 ENCOUNTER — Other Ambulatory Visit: Payer: Self-pay

## 2018-05-11 ENCOUNTER — Encounter (HOSPITAL_COMMUNITY): Payer: Self-pay | Admitting: Emergency Medicine

## 2018-05-11 DIAGNOSIS — B349 Viral infection, unspecified: Secondary | ICD-10-CM

## 2018-05-11 NOTE — Discharge Instructions (Signed)
No alarming signs on exam. You can take tylenol/motrin for headache. Otherwise, your exam was normal. Can return to work.

## 2018-05-11 NOTE — ED Provider Notes (Signed)
[facility]    CSN: [id number] Arrival date & time: 05/11/18  1329     History   Chief Complaint Chief Complaint  Patient presents with  . work note    HPI Erin Duncan is a 23 y.o. female.   23 year old female comes in for evaluation to return to work. States a few days ago, had some rhinorrhea, nasal congestion. Checked temperature at work with 99.3 and was sent home. Was told to be cleared for work. Denies cough, chest pain, shortness of breath. Denies weakness, dizziness, syncope. States about 3 weeks ago, traveled to [facility] via car.      Past Medical History:  Diagnosis Date  . Chlamydia 07/2016  . Elevated liver function tests 2019   elevated AST, ALT    Patient Active Problem List   Diagnosis Date Noted  . Irregular menses 08/24/2016    Past Surgical History:  Procedure Laterality Date  . WISDOM TOOTH EXTRACTION  05/27/2017    OB History    Gravida  0   Para  0   Term  0   Preterm  0   AB  0   Living  0     SAB  0   TAB  0   Ectopic  0   Multiple  0   Live Births  0            Home Medications    Prior to Admission medications   Medication Sig Start Date End Date Taking? Authorizing Provider  norethindrone-ethinyl estradiol (JUNEL FE,GILDESS FE,LOESTRIN FE) 1-20 MG-MCG tablet Take 1 tablet by mouth daily. 08/26/17  Yes Amundson Shirley Friar, MD  PREVIDENT 5000 SENSITIVE 1.1-5 % PSTE U ONCE A DAY HS 07/01/17   [provider]    Family History Family History  Problem Relation Age of Onset  . Anemia Mother   . Thyroid disease Mother   . Diabetes Maternal Grandmother     Social History Social History   Tobacco Use  . Smoking status: Never Smoker  . Smokeless tobacco: Never Used  Substance Use Topics  . Alcohol use: No  . Drug use: No     Allergies   Patient has no known allergies.   Review of Systems Review of Systems  Reason unable to perform ROS: See HPI as above.     Physical Exam  Triage Vital Signs ED Triage Vitals  Enc Vitals Group     BP 05/11/18 1346 112/78     Pulse Rate 05/11/18 1346 83     Resp 05/11/18 1346 12     Temp 05/11/18 1346 97.7 F (36.5 C)     Temp Source 05/11/18 1346 Oral     SpO2 05/11/18 1346 96 %     Weight --      Height --      Head Circumference --      Peak Flow --      Pain Score 05/11/18 1347 0     Pain Loc --      Pain Edu? --      Excl. in GC? --    No data found.  Updated Vital Signs BP 112/78 (BP Location: Right Arm)   Pulse 83   Temp 97.7 F (36.5 C) (Oral)   Resp 12   LMP 04/13/2018 (Approximate)   SpO2 96%   Visual Acuity Right Eye Distance:   Left Eye Distance:   Bilateral Distance:    Right Eye  Near:   Left Eye Near:    Bilateral Near:     Physical Exam Constitutional:      General: She is not in acute distress.    Appearance: She is well-developed. She is not ill-appearing, toxic-appearing or diaphoretic.  HENT:     Head: Normocephalic and atraumatic.     Right Ear: Tympanic membrane, ear canal and external ear normal. Tympanic membrane is not erythematous or bulging.     Left Ear: Tympanic membrane, ear canal and external ear normal. Tympanic membrane is not erythematous or bulging.     Nose: Nose normal.     Right Sinus: No maxillary sinus tenderness or frontal sinus tenderness.     Left Sinus: No maxillary sinus tenderness or frontal sinus tenderness.     Mouth/Throat:     Mouth: Mucous membranes are moist.     Pharynx: Oropharynx is clear. Uvula midline.  Eyes:     Conjunctiva/sclera: Conjunctivae normal.     Pupils: Pupils are equal, round, and reactive to light.  Neck:     Musculoskeletal: Normal range of motion and neck supple.  Cardiovascular:     Rate and Rhythm: Normal rate and regular rhythm.     Heart sounds: Normal heart sounds. No murmur. No friction rub. No gallop.   Pulmonary:     Effort: Pulmonary effort is normal. No accessory muscle usage, prolonged expiration, respiratory  distress or retractions.     Breath sounds: Normal breath sounds. No stridor, decreased air movement or transmitted upper airway sounds. No decreased breath sounds, wheezing, rhonchi or rales.  Skin:    General: Skin is warm and dry.  Neurological:     Mental Status: She is alert and oriented to person, place, and time.      UC Treatments / Results  Labs (all labs ordered are listed, but only abnormal results are displayed) Labs Reviewed - No data to display  EKG None  Radiology No results found.  Procedures Procedures (including critical care time)  Medications Ordered in UC Medications - No data to display  Initial Impression / Assessment and Plan / UC Course  I have reviewed the triage vital signs and the nursing notes.  Pertinent labs & imaging results that were available during my care of the patient were reviewed by me and considered in my medical decision making (see chart for details).    Exam unremarkable, can return to work. Recheck as needed.  Final Clinical Impressions(s) / UC Diagnoses   Final diagnoses:  Viral illness    ED Prescriptions    None       Belinda Fisher, PA-C 05/11/18 1422

## 2018-05-11 NOTE — ED Triage Notes (Addendum)
Pt reports having a temperature of 99.3 at work this week.  Pt was sent home and told she has to be cleared to come back to work.  Pt is asymptomatic today.

## 2018-05-11 NOTE — ED Notes (Signed)
Bed: UC01 Expected date:  Expected time:  Means of arrival:  Comments: 1:30 Appt

## 2018-09-18 ENCOUNTER — Ambulatory Visit: Payer: Managed Care, Other (non HMO) | Admitting: Obstetrics and Gynecology

## 2018-11-01 ENCOUNTER — Other Ambulatory Visit: Payer: Self-pay | Admitting: Obstetrics and Gynecology

## 2018-11-04 NOTE — Telephone Encounter (Signed)
Medication refill request: Junel Fe 1/20 Last AEX:  08/26/2017 Next AEX: 12/05/2018 Last MMG (if hormonal medication request): N/A Refill authorized: Junel Fe 1/20 #1 0RF sent to pharmacy on file to get patient to her next aex

## 2018-12-05 ENCOUNTER — Ambulatory Visit: Payer: 59 | Admitting: Obstetrics and Gynecology

## 2018-12-11 ENCOUNTER — Encounter: Payer: Self-pay | Admitting: Certified Nurse Midwife

## 2018-12-12 ENCOUNTER — Ambulatory Visit: Payer: 59 | Admitting: Certified Nurse Midwife

## 2018-12-15 ENCOUNTER — Ambulatory Visit: Payer: 59 | Admitting: Certified Nurse Midwife

## 2019-01-16 ENCOUNTER — Ambulatory Visit: Payer: 59 | Admitting: Certified Nurse Midwife

## 2019-03-30 ENCOUNTER — Encounter: Payer: 59 | Admitting: Nurse Practitioner

## 2019-04-14 DIAGNOSIS — Z20822 Contact with and (suspected) exposure to covid-19: Secondary | ICD-10-CM | POA: Diagnosis not present

## 2019-04-20 ENCOUNTER — Encounter: Payer: Self-pay | Admitting: Nurse Practitioner

## 2019-04-29 ENCOUNTER — Encounter: Payer: Self-pay | Admitting: Nurse Practitioner

## 2019-04-29 DIAGNOSIS — Z0289 Encounter for other administrative examinations: Secondary | ICD-10-CM

## 2019-05-11 ENCOUNTER — Encounter: Payer: Self-pay | Admitting: Certified Nurse Midwife

## 2019-05-13 ENCOUNTER — Encounter: Payer: Self-pay | Admitting: Certified Nurse Midwife

## 2019-06-17 DIAGNOSIS — Z20828 Contact with and (suspected) exposure to other viral communicable diseases: Secondary | ICD-10-CM | POA: Diagnosis not present

## 2019-06-17 DIAGNOSIS — Z03818 Encounter for observation for suspected exposure to other biological agents ruled out: Secondary | ICD-10-CM | POA: Diagnosis not present

## 2019-09-15 ENCOUNTER — Telehealth: Payer: Self-pay | Admitting: Obstetrics and Gynecology

## 2019-09-15 NOTE — Telephone Encounter (Signed)
Patient returned call

## 2019-09-15 NOTE — Telephone Encounter (Signed)
Patient is having some "bloating with pain".

## 2019-09-15 NOTE — Telephone Encounter (Signed)
Left message to call Noreene Larsson, RN at [facility] [phone].   Last AEX 08/26/17

## 2019-09-15 NOTE — Telephone Encounter (Signed)
Spoke with patient. Patient reports intermittent lower abdominal pain and bloating. Symptoms started a few months ago. Changed her diet to eliminate dairy, no change in symptoms. Has gained 10 lbs over the past 4 months. Last BM 09/15/19, usually has a BM q other day, requires a laxative or detox tea.  Reports menses have been irregular, had 2 menses in the month of June. LMP 6/12 -6/16. No menses for the month of July. UPT x2 negative on 09/14/19. SA, condoms sometimes. Previously on OCP, stopped during the start of Covid 19 pandemic. Denies N/V, fever/chills. Denies pain at this time. Requesting OV.   Last AEX 08/26/17 Hx of chlamydia.   OV scheduled for 10/09/19 at 8:30am with Dr. Edward Jolly. Patient declined earlier OV on 7/28.   AEX scheduled for 12/30/19 at 8am.   Advised patient to return call to office if new symptoms develop or symptoms worsen. Will review with Dr. Edward Jolly and return call if any additional recommendations. Patient agreeable.   Routing to provider for final review. Patient is agreeable to disposition. Will close encounter.

## 2019-10-03 DIAGNOSIS — Z20822 Contact with and (suspected) exposure to covid-19: Secondary | ICD-10-CM | POA: Diagnosis not present

## 2019-10-07 NOTE — Progress Notes (Signed)
GYNECOLOGY  VISIT   HPI: 24 y.o.   Single  Hispanic  female   G0P0000 with Patient's last menstrual period was 08/08/2019 (exact date).   here for bloating & irregular cycle. Reports she is having lower abdominal pain/heaviness that started 1 month ago. Focus of discomfort is RLQ. States something does not feel right.   Reporting constipation.  She took magnesium citrate and this did not produce bowel movements.  Last BM was yesterday.  Usually has BM every 2 - 3 days.  No diarrhea.   Gained over 10 pounds over the last 2 months.   She is sensitive to some foods.  She noticed sensitivity to dairy, but this has resolved.  Cookies can also make her bloat.   Stopped fast foods about 1 month ago and drinking more water. Eating more fruits and vegetables, 4 fruits, 1 vegetable.  Cereal at night.   She did 3 UPTs at home, and they were negative.   New partner 9 months ago.  Sometimes uses condoms.   No menses since June, 2021.  Hx irregular menses in 2018 and had a normal TSH and prolactin.   No milky discharge from breast.  HA one or two per week.  No loss of peripheral vision.  No increased hair growth or loss.   UPT - negative.   GYNECOLOGIC HISTORY: Patient's last menstrual period was 08/08/2019 (exact date). Contraception:  Condoms sometimes Menopausal hormone therapy:  none Last mammogram:  none Last pap smear:  08-26-17 neg        OB History    Gravida  0   Para  0   Term  0   Preterm  0   AB  0   Living  0     SAB  0   TAB  0   Ectopic  0   Multiple  0   Live Births  0              Patient Active Problem List   Diagnosis Date Noted  . Irregular menses 08/24/2016    Past Medical History:  Diagnosis Date  . Chlamydia 07/2016  . Elevated liver function tests 2019   elevated AST, ALT    Past Surgical History:  Procedure Laterality Date  . WISDOM TOOTH EXTRACTION  05/27/2017    Current Outpatient Medications  Medication Sig  Dispense Refill  . PREVIDENT 5000 SENSITIVE 1.1-5 % PSTE U ONCE A DAY HS (Patient not taking: Reported on 10/09/2019)  0   No current facility-administered medications for this visit.     ALLERGIES: Patient has no known allergies.  Family History  Problem Relation Age of Onset  . Anemia Mother   . Thyroid disease Mother   . Diabetes Maternal Grandmother     Social History   Socioeconomic History  . Marital status: Single    Spouse name: Not on file  . Number of children: Not on file  . Years of education: Not on file  . Highest education level: Not on file  Occupational History  . Not on file  Tobacco Use  . Smoking status: Never Smoker  . Smokeless tobacco: Never Used  Vaping Use  . Vaping Use: Never used  Substance and Sexual Activity  . Alcohol use: No  . Drug use: No  . Sexual activity: Yes    Birth control/protection: Condom  Other Topics Concern  . Not on file  Social History Narrative  . Not on file   Social Determinants  of Health   Financial Resource Strain:   . Difficulty of Paying Living Expenses:   Food Insecurity:   . Worried About Programme researcher, broadcasting/film/video in the Last Year:   . Barista in the Last Year:   Transportation Needs:   . Freight forwarder (Medical):   Marland Kitchen Lack of Transportation (Non-Medical):   Physical Activity:   . Days of Exercise per Week:   . Minutes of Exercise per Session:   Stress:   . Feeling of Stress :   Social Connections:   . Frequency of Communication with Friends and Family:   . Frequency of Social Gatherings with Friends and Family:   . Attends Religious Services:   . Active Member of Clubs or Organizations:   . Attends Banker Meetings:   Marland Kitchen Marital Status:   Intimate Partner Violence:   . Fear of Current or Ex-Partner:   . Emotionally Abused:   Marland Kitchen Physically Abused:   . Sexually Abused:     Review of Systems  Constitutional: Negative.   HENT: Negative.   Eyes: Negative.   Respiratory:  Negative.   Cardiovascular: Negative.   Gastrointestinal: Positive for abdominal distention, abdominal pain and constipation.  Endocrine: Negative.   Genitourinary: Positive for menstrual problem.  Musculoskeletal: Negative.   Skin: Negative.   Allergic/Immunologic: Negative.   Neurological: Negative.   Hematological: Negative.   Psychiatric/Behavioral: Negative.     PHYSICAL EXAMINATION:    BP 118/68 (BP Location: Right Arm, Patient Position: Sitting, Cuff Size: Normal)   Pulse 64   Wt 135 lb (61.2 kg)   LMP 08/08/2019 (Exact Date)   BMI 25.09 kg/m     General appearance: alert, cooperative and appears stated age   Abdomen: soft, non-tender, mildly tender lower abdomen,  no organomegaly    Pelvic: External genitalia:  no lesions              Urethra:  normal appearing urethra with no masses, tenderness or lesions              Bartholins and Skenes: normal                 Vagina: normal appearing vagina with normal color and discharge, no lesions              Cervix: no lesions                Bimanual Exam:  Uterus:  normal size, contour, position, consistency, mobility, non-tender              Adnexa: no mass, fullness, tenderness              Rectal exam: Yes.  .  Confirms.              Anus:  normal sphincter tone, no lesions  Chaperone was present for exam.  ASSESSMENT  Irregular menses.  Pelvic pressure, RLQ focus. Hx chlamydia.  Food intolerance.  STD screening.   PLAN  FSH, LH, estradiol, prolactin, TSH.  Tissue transglutaminase to check for celiac.  STD screening.  Return for pelvic ultrasound.  We discussed constipation and a health diet and exercise to help with bowel function.  May need referral to GI if no clear explanation for GI symptoms.

## 2019-10-09 ENCOUNTER — Other Ambulatory Visit: Payer: Self-pay

## 2019-10-09 ENCOUNTER — Other Ambulatory Visit (HOSPITAL_COMMUNITY)
Admission: RE | Admit: 2019-10-09 | Discharge: 2019-10-09 | Disposition: A | Discharge status: Home / Self Care | Payer: Self-pay | Source: Ambulatory Visit | Attending: Obstetrics and Gynecology | Admitting: Obstetrics and Gynecology

## 2019-10-09 ENCOUNTER — Encounter: Payer: Self-pay | Admitting: Obstetrics and Gynecology

## 2019-10-09 ENCOUNTER — Ambulatory Visit (INDEPENDENT_AMBULATORY_CARE_PROVIDER_SITE_OTHER): Payer: BC Managed Care – PPO | Admitting: Obstetrics and Gynecology

## 2019-10-09 VITALS — BP 118/68 | HR 64 | Wt 135.0 lb

## 2019-10-09 DIAGNOSIS — Z113 Encounter for screening for infections with a predominantly sexual mode of transmission: Secondary | ICD-10-CM | POA: Diagnosis not present

## 2019-10-09 DIAGNOSIS — N926 Irregular menstruation, unspecified: Secondary | ICD-10-CM | POA: Diagnosis not present

## 2019-10-09 DIAGNOSIS — K9049 Malabsorption due to intolerance, not elsewhere classified: Secondary | ICD-10-CM

## 2019-10-09 DIAGNOSIS — R102 Pelvic and perineal pain: Secondary | ICD-10-CM

## 2019-10-09 LAB — POCT URINE PREGNANCY: Preg Test, Ur: NEGATIVE

## 2019-10-09 NOTE — Patient Instructions (Signed)

## 2019-10-11 LAB — HEP, RPR, HIV PANEL
HIV Screen 4th Generation wRfx: NONREACTIVE
Hepatitis B Surface Ag: NEGATIVE
RPR Ser Ql: NONREACTIVE

## 2019-10-11 LAB — TSH: TSH: 2.07 u[IU]/mL (ref 0.450–4.500)

## 2019-10-11 LAB — TISSUE TRANSGLUTAMINASE, IGA: Transglutaminase IgA: <2 U/mL (ref 0–3)

## 2019-10-11 LAB — FSH/LH
FSH: 8.7 m[IU]/mL
LH: 22.6 m[IU]/mL

## 2019-10-11 LAB — ESTRADIOL: Estradiol: 34.2 pg/mL

## 2019-10-11 LAB — HEPATITIS C ANTIBODY: Hep C Virus Ab: <0.1 s/co ratio (ref 0.0–0.9)

## 2019-10-11 LAB — PROLACTIN: Prolactin: 13.3 ng/mL (ref 4.8–23.3)

## 2019-10-12 ENCOUNTER — Telehealth: Payer: Self-pay | Admitting: Obstetrics and Gynecology

## 2019-10-12 LAB — CERVICOVAGINAL ANCILLARY ONLY
Chlamydia: NEGATIVE
Comment: NEGATIVE
Comment: NEGATIVE
Comment: NORMAL
Neisseria Gonorrhea: NEGATIVE
Trichomonas: NEGATIVE

## 2019-10-12 NOTE — Telephone Encounter (Signed)
Spoke with patient regarding benefits for recommended ultrasound. Patient is aware that ultrasound is transvaginal. Patient acknowledges understanding of information presented. Patient is aware of cancellation policy. Patient scheduled appointment for 10/22/2019 at 0930AM with Brook A. Edward Jolly, MD, [facility]. Encounter closed.

## 2019-10-13 ENCOUNTER — Other Ambulatory Visit: Payer: Self-pay | Admitting: *Deleted

## 2019-10-13 MED ORDER — MEDROXYPROGESTERONE ACETATE 10 MG PO TABS
10.0000 mg | ORAL_TABLET | Freq: Every day | ORAL | 0 refills | Status: DC
Start: 2019-10-13 — End: 2019-11-05

## 2019-10-22 ENCOUNTER — Ambulatory Visit: Payer: BC Managed Care – PPO | Admitting: Obstetrics and Gynecology

## 2019-10-22 ENCOUNTER — Encounter: Payer: Self-pay | Admitting: Obstetrics and Gynecology

## 2019-10-22 ENCOUNTER — Other Ambulatory Visit: Payer: Self-pay

## 2019-10-22 ENCOUNTER — Ambulatory Visit (INDEPENDENT_AMBULATORY_CARE_PROVIDER_SITE_OTHER): Payer: BC Managed Care – PPO

## 2019-10-22 VITALS — BP 100/70 | HR 70 | Ht 61.5 in | Wt 135.0 lb

## 2019-10-22 DIAGNOSIS — R140 Abdominal distension (gaseous): Secondary | ICD-10-CM

## 2019-10-22 DIAGNOSIS — K5900 Constipation, unspecified: Secondary | ICD-10-CM | POA: Diagnosis not present

## 2019-10-22 DIAGNOSIS — R1031 Right lower quadrant pain: Secondary | ICD-10-CM

## 2019-10-22 DIAGNOSIS — R102 Pelvic and perineal pain: Secondary | ICD-10-CM

## 2019-10-22 DIAGNOSIS — E282 Polycystic ovarian syndrome: Secondary | ICD-10-CM | POA: Diagnosis not present

## 2019-10-22 NOTE — Progress Notes (Signed)
GYNECOLOGY  VISIT   HPI: 24 y.o.   Single  Hispanic  female   G0P0000 with Patient's last menstrual period was 08/08/2019 (approximate).   here for pelvic ultrasound for RLQ pain of one month duration.   Skipped cycles.  Labs consistent with PCOS.  Given a course of Provera.  Taking the last pill today.   Stopped birth control pills in the past due to the pandemic. Skipped menses off pills.  Labs show LH>FSH, estradiol 34.2.  Constipation and abdominal bloating.  Wants to see GI.   GYNECOLOGIC HISTORY: Patient's last menstrual period was 08/08/2019 (approximate). Contraception: Condoms sometimes Menopausal hormone therapy:  n/a Last mammogram:  n/a Last pap smear: 08-26-17 Neg        OB History    Gravida  0   Para  0   Term  0   Preterm  0   AB  0   Living  0     SAB  0   TAB  0   Ectopic  0   Multiple  0   Live Births  0              Patient Active Problem List   Diagnosis Date Noted  . Irregular menses 08/24/2016    Past Medical History:  Diagnosis Date  . Chlamydia 07/2016  . Elevated liver function tests 2019   elevated AST, ALT    Past Surgical History:  Procedure Laterality Date  . WISDOM TOOTH EXTRACTION  05/27/2017    Current Outpatient Medications  Medication Sig Dispense Refill  . medroxyPROGESTERone (PROVERA) 10 MG tablet Take 1 tablet (10 mg total) by mouth daily for 10 days. 10 tablet 0  . PREVIDENT 5000 SENSITIVE 1.1-5 % PSTE U ONCE A DAY HS  0   No current facility-administered medications for this visit.     ALLERGIES: Patient has no known allergies.  Family History  Problem Relation Age of Onset  . Anemia Mother   . Thyroid disease Mother   . Diabetes Maternal Grandmother     Social History   Socioeconomic History  . Marital status: Single    Spouse name: Not on file  . Number of children: Not on file  . Years of education: Not on file  . Highest education level: Not on file  Occupational History  . Not  on file  Tobacco Use  . Smoking status: Never Smoker  . Smokeless tobacco: Never Used  Vaping Use  . Vaping Use: Never used  Substance and Sexual Activity  . Alcohol use: No  . Drug use: No  . Sexual activity: Yes    Birth control/protection: Condom  Other Topics Concern  . Not on file  Social History Narrative  . Not on file   Social Determinants of Health   Financial Resource Strain:   . Difficulty of Paying Living Expenses: Not on file  Food Insecurity:   . Worried About Programme researcher, broadcasting/film/video in the Last Year: Not on file  . Ran Out of Food in the Last Year: Not on file  Transportation Needs:   . Lack of Transportation (Medical): Not on file  . Lack of Transportation (Non-Medical): Not on file  Physical Activity:   . Days of Exercise per Week: Not on file  . Minutes of Exercise per Session: Not on file  Stress:   . Feeling of Stress : Not on file  Social Connections:   . Frequency of Communication with Friends and Family: Not  on file  . Frequency of Social Gatherings with Friends and Family: Not on file  . Attends Religious Services: Not on file  . Active Member of Clubs or Organizations: Not on file  . Attends Banker Meetings: Not on file  . Marital Status: Not on file  Intimate Partner Violence:   . Fear of Current or Ex-Partner: Not on file  . Emotionally Abused: Not on file  . Physically Abused: Not on file  . Sexually Abused: Not on file    Review of Systems  All other systems reviewed and are negative.   PHYSICAL EXAMINATION:    BP 100/70   Pulse 70   Ht 5' 1.5" (1.562 m)   Wt 135 lb (61.2 kg)   LMP 08/08/2019 (Approximate)   BMI 25.09 kg/m     General appearance: alert, cooperative and appears stated age  Pelvic US  Uterus no masses. EMS 3.69 mm.  Bilateral ovaries with 18 - 20 small follicles.  No free fluid.   ASSESSMENT  RLQ pain.  Bloating and constipation.  PCOS.   PLAN  Ultrasound reviewed.  In addition to  reviewing [place], we discussed several issues.  PCOS reviewed.  Effect on menses, fertility, and potential cardiovascular disease/diabetes. We talked about cycle regulation through monthly Provera 10 mg x 10 days or birth control pills.   Patient prefers Provera and will contact the office to report if she started a cycle with her current course of Provera.  She chooses condoms for pregnancy prevention.  Already taking probiotic.  Will refer to GI.  List of PCPs to patient.  She will need an annual exam.

## 2019-10-26 ENCOUNTER — Telehealth: Payer: Self-pay

## 2019-10-26 DIAGNOSIS — K5900 Constipation, unspecified: Secondary | ICD-10-CM

## 2019-10-26 DIAGNOSIS — R140 Abdominal distension (gaseous): Secondary | ICD-10-CM

## 2019-10-26 DIAGNOSIS — R1031 Right lower quadrant pain: Secondary | ICD-10-CM

## 2019-10-26 NOTE — Telephone Encounter (Signed)
Patient is calling in regards to speaking with nurse about a gastroenterology referral.

## 2019-10-26 NOTE — Telephone Encounter (Signed)
Spoke with pt. Pt states need referral per Dr Edward Jolly from OV on 10/22/19 to GI. Pt states called and made appt with [facility] in [place] for 10/27/19 at 12 noon with  Pt states had intense pain over weekend, almost went to ER for sx. Sx have subsided today. Pt advised to keep appt and will send referral to their office. Pt agreeable and verbalized understanding.  Advised will update Dr Edward Jolly. Pt agreeable.   Routing to Dr Edward Jolly for update and review. Encounter closed.  Referral placed. Cc Rosa as update   Per reviewed OV notes on 10/22/19: will refer to GI for abd bloating, constipation.

## 2019-10-27 DIAGNOSIS — K5900 Constipation, unspecified: Secondary | ICD-10-CM | POA: Diagnosis not present

## 2019-11-05 ENCOUNTER — Telehealth: Payer: Self-pay | Admitting: Obstetrics and Gynecology

## 2019-11-05 ENCOUNTER — Other Ambulatory Visit: Payer: Self-pay | Admitting: Obstetrics and Gynecology

## 2019-11-05 MED ORDER — MEDROXYPROGESTERONE ACETATE 10 MG PO TABS
10.0000 mg | ORAL_TABLET | Freq: Every day | ORAL | 0 refills | Status: DC
Start: 2019-11-05 — End: 2019-12-30

## 2019-11-05 MED ORDER — MEDROXYPROGESTERONE ACETATE 10 MG PO TABS: 10.0000 mg | ORAL_TABLET | Freq: Every day | ORAL | 0 refills | Status: DC

## 2019-11-05 NOTE — Telephone Encounter (Signed)
Left detailed message to pt ok per DPR. Pt to return call to office with any questions or concerns with Rx.  Rx Provera 10mg  # 30, 0RF sent to pharmacy on file.  Will send pt a Mychart message as well.  Encounter closed.

## 2019-11-05 NOTE — Telephone Encounter (Signed)
Patient calling to let the nurse know she started her cycle 10/27/19 and it stopped 11/04/19.

## 2019-11-05 NOTE — Telephone Encounter (Signed)
Ok for Provera 10 mg x 10 days monthly.  Disp:  30 tabs.  RF:  none.   Keep appointment for annual exam in November, 2021.

## 2019-11-05 NOTE — Telephone Encounter (Signed)
Spoke with pt. Pt calling  to give update to Dr Edward Jolly for starting cycle after taking Provera Rx.  Pt had LMP 8/31-11/04/19. Started Provera on 10/13/19. States cycle regular flow cycle. Denies any pain or cramps now, but had normal menstrual cramps during cycle.   States had GI referral appt x 1 week ago. Pt now taking fiber supplement and mild laxative to improve regularity. Has another appt in Nov for follow up Dr Yetta Barre at [facility].    Per reviewed notes: 10/22/19 PLAN Ultrasound reviewed.  In addition to reviewing [place], we discussed several issues.  PCOS reviewed.  Effect on menses, fertility, and potential cardiovascular disease/diabetes. We talked about cycle regulation through monthly Provera 10 mg x 10 days or birth control pills.   Patient prefers Provera and will contact the office to report if she started a cycle with her current course of Provera.   Routing to Dr Edward Jolly. Please advise for further recommendations on Provera.

## 2019-11-27 DIAGNOSIS — K219 Gastro-esophageal reflux disease without esophagitis: Secondary | ICD-10-CM

## 2019-11-27 HISTORY — DX: Gastro-esophageal reflux disease without esophagitis: K21.9

## 2019-12-02 ENCOUNTER — Telehealth: Payer: Self-pay | Admitting: Obstetrics and Gynecology

## 2019-12-02 NOTE — Telephone Encounter (Signed)
I agree with her seeing GI tomorrow.  I will look forward to seeing her in the office in November, unless she needs something sooner.

## 2019-12-02 NOTE — Telephone Encounter (Signed)
Patient is having some has not been "feeling well lately and has gained 10 pounds in 2 weeks" . She had a negative otc pregnancy test.

## 2019-12-02 NOTE — Telephone Encounter (Signed)
OV 8/26 for RLQ abd pain PCOS Constipation and bloating   Spoke with pt. Pt states hasn't " felt well" the last 3-4 days with abd cramps and pain like last month. Pt states had normal BM yesterday and then had stomach pains that were "severe" and then was bloated with some nausea and vomited x 1. Denies NVD today. Pt states has also "gained 10 lbs in the last 2 weeks" and keeps taking home UPTs that all are negative. Pt states LMP 9/17-24 and was normal cycle. States took Provera Rx on 9/17. Pt is SA but using protection.  Pt states has GI appt tomorrow 10/7 at 830 am. Pt advised to keep GI appt and return call if needs follow up appt with Dr Edward Jolly. Pt agreeable. Pt has next AEX scheduled for 11/3 at 8 am with Dr Edward Jolly. Pt verbalized understanding.  Advised will give update to Dr Edward Jolly. Pt agreeable.   Routing to Dr Edward Jolly.

## 2019-12-02 NOTE — Telephone Encounter (Signed)
Encounter closed

## 2019-12-03 DIAGNOSIS — R635 Abnormal weight gain: Secondary | ICD-10-CM | POA: Diagnosis not present

## 2019-12-03 DIAGNOSIS — R112 Nausea with vomiting, unspecified: Secondary | ICD-10-CM | POA: Diagnosis not present

## 2019-12-03 DIAGNOSIS — Z791 Long term (current) use of non-steroidal anti-inflammatories (NSAID): Secondary | ICD-10-CM | POA: Diagnosis not present

## 2019-12-03 DIAGNOSIS — K5900 Constipation, unspecified: Secondary | ICD-10-CM | POA: Diagnosis not present

## 2019-12-03 DIAGNOSIS — R1013 Epigastric pain: Secondary | ICD-10-CM | POA: Diagnosis not present

## 2019-12-07 DIAGNOSIS — K6289 Other specified diseases of anus and rectum: Secondary | ICD-10-CM | POA: Diagnosis not present

## 2019-12-07 DIAGNOSIS — K2100 Gastro-esophageal reflux disease with esophagitis, without bleeding: Secondary | ICD-10-CM | POA: Diagnosis not present

## 2019-12-07 DIAGNOSIS — K8020 Calculus of gallbladder without cholecystitis without obstruction: Secondary | ICD-10-CM | POA: Diagnosis not present

## 2019-12-07 DIAGNOSIS — K8050 Calculus of bile duct without cholangitis or cholecystitis without obstruction: Secondary | ICD-10-CM | POA: Diagnosis not present

## 2019-12-07 DIAGNOSIS — Z3202 Encounter for pregnancy test, result negative: Secondary | ICD-10-CM | POA: Diagnosis not present

## 2019-12-08 DIAGNOSIS — R9431 Abnormal electrocardiogram [ECG] [EKG]: Secondary | ICD-10-CM | POA: Diagnosis not present

## 2019-12-18 DIAGNOSIS — R1084 Generalized abdominal pain: Secondary | ICD-10-CM | POA: Diagnosis not present

## 2019-12-18 DIAGNOSIS — K5900 Constipation, unspecified: Secondary | ICD-10-CM | POA: Diagnosis not present

## 2019-12-18 DIAGNOSIS — K2970 Gastritis, unspecified, without bleeding: Secondary | ICD-10-CM | POA: Diagnosis not present

## 2019-12-18 DIAGNOSIS — K2950 Unspecified chronic gastritis without bleeding: Secondary | ICD-10-CM | POA: Diagnosis not present

## 2019-12-18 DIAGNOSIS — R1013 Epigastric pain: Secondary | ICD-10-CM | POA: Diagnosis not present

## 2019-12-28 NOTE — Progress Notes (Signed)
24 y.o. G0P0000 Single Hispanic female here for annual exam.    No cycle in October because she forgot to take Provera. Cycle did began on it's own 12-27-19. States Provera works well to bring her period.   Has headaches 2 -3 times per week.  Uses Tylenol to treat pain.  No nausea or vomiting.  Can have sensitivity to light.  She gave up caffeine.   Had colonoscopy and endoscopy 11/2019 due to constipation and epigastric pain.  Negative STD screening on 10/09/19.  Would be ok if pregnancy occurred but is not trying for this.  Completed Covid vaccine.  Has not done booster vaccine. Has not done flu vaccine.  PCP:   None.  She has a list of PCPs.   Patient's last menstrual period was 12/27/2019 (exact date).     Period Cycle (Days):  (cycles irregular without Provera) Period Pattern: (!) Irregular     Sexually active: Yes.    The current method of family planning is condoms sometimes  Exercising: Yes.    jump roping and biking Smoker:  no  Health Maintenance: Pap: 08-26-17 Neg History of abnormal Pap:  no MMG:  n/a Colonoscopy:  n/a BMD:   n/a  Result  n/a TDaP: Unsure Gardasil:   Yes, completed HIV: 08-24-16 NR Hep C: 08-24-16 Neg Screening Labs:     reports that she has never smoked. She has never used smokeless tobacco. She reports that she does not drink alcohol and does not use drugs.  Past Medical History:  Diagnosis Date  . Chlamydia 07/2016  . Elevated liver function tests 2019   elevated AST, ALT  . GERD (gastroesophageal reflux disease) 11/2019    Past Surgical History:  Procedure Laterality Date  . WISDOM TOOTH EXTRACTION  05/27/2017    Current Outpatient Medications  Medication Sig Dispense Refill  . medroxyPROGESTERone (PROVERA) 10 MG tablet Take 1 tablet (10 mg total) by mouth daily. Take for 10 days every month. 30 tablet 0  . pantoprazole (PROTONIX) 40 MG tablet Take 1 tablet by mouth daily. (Patient not taking: Reported on 12/30/2019)    .  sucralfate (CARAFATE) 1 g tablet Take 1 g by mouth 4 (four) times daily. (Patient not taking: Reported on 12/30/2019)     No current facility-administered medications for this visit.    Family History  Problem Relation Age of Onset  . Anemia Mother   . Thyroid disease Mother   . Diabetes Father   . Diabetes Maternal Grandmother     Review of Systems  Neurological: Positive for headaches (headaches 2-3x/week).  All other systems reviewed and are negative.   Exam:   BP 102/64   Pulse 90   Ht 5' 1.5" (1.562 m)   Wt 139 lb (63 kg)   LMP 12/27/2019 (Exact Date)   SpO2 98%   BMI 25.84 kg/m     General appearance: alert, cooperative and appears stated age Head: normocephalic, without obvious abnormality, atraumatic Neck: no adenopathy, supple, symmetrical, trachea midline and thyroid normal to inspection and palpation Lungs: clear to auscultation bilaterally Breasts: normal appearance, no masses or tenderness, No nipple retraction or dimpling, No nipple discharge or bleeding, No axillary adenopathy Heart: regular rate and rhythm Abdomen: soft, non-tender; no masses, no organomegaly Extremities: extremities normal, atraumatic, no cyanosis or edema Skin: skin color, texture, turgor normal. No rashes or lesions Lymph nodes: cervical, supraclavicular, and axillary nodes normal. Neurologic: grossly normal  Pelvic: External genitalia:  no lesions  No abnormal inguinal nodes palpated.              Urethra:  normal appearing urethra with no masses, tenderness or lesions              Bartholins and Skenes: normal                 Vagina: normal appearing vagina with normal color and discharge, no lesions              Cervix: no lesions.  Menstrual flow.              Pap taken: No. Bimanual Exam:  Uterus:  normal size, contour, position, consistency, mobility, non-tender              Adnexa: no mass, fullness, tenderness                 Chaperone was present for  exam.  Assessment:   Well woman visit with normal exam. Probable PCOS.  Provera for cycle regulation.   Plan: Mammogram screening age 71 yo. Self breast awareness reviewed. Pap 2022. Guidelines for Calcium, Vitamin D, regular exercise program including cardiovascular and weight bearing exercise. Start PNV.  Refill of Provera 10 mg x 10 days per month.   I discussed avoidance of tobacco, ETOH, medications, and changing litter box if exposed to potential pregnancy. Vaccines discussed.  Cholesterol panel today.  She will follow up with her GI. Follow up annually and prn.

## 2019-12-30 ENCOUNTER — Other Ambulatory Visit: Payer: Self-pay

## 2019-12-30 ENCOUNTER — Ambulatory Visit: Payer: BC Managed Care – PPO | Admitting: Obstetrics and Gynecology

## 2019-12-30 ENCOUNTER — Encounter: Payer: Self-pay | Admitting: Obstetrics and Gynecology

## 2019-12-30 VITALS — BP 102/64 | HR 90 | Ht 61.5 in | Wt 139.0 lb

## 2019-12-30 DIAGNOSIS — E7800 Pure hypercholesterolemia, unspecified: Secondary | ICD-10-CM | POA: Diagnosis not present

## 2019-12-30 DIAGNOSIS — Z01419 Encounter for gynecological examination (general) (routine) without abnormal findings: Secondary | ICD-10-CM | POA: Diagnosis not present

## 2019-12-30 MED ORDER — MEDROXYPROGESTERONE ACETATE 10 MG PO TABS: 10.0000 mg | ORAL_TABLET | Freq: Every day | ORAL | 3 refills | Status: DC

## 2019-12-30 NOTE — Patient Instructions (Signed)

## 2019-12-31 LAB — LIPID PANEL
Chol/HDL Ratio: 3.5 ratio (ref 0.0–4.4)
Cholesterol, Total: 198 mg/dL (ref 100–199)
HDL: 56 mg/dL (ref >39)
LDL Chol Calc (NIH): 125 mg/dL — ABNORMAL HIGH (ref 0–99)
Triglycerides: 96 mg/dL (ref 0–149)
VLDL Cholesterol Cal: 17 mg/dL (ref 5–40)

## 2020-02-27 HISTORY — PX: CHOLECYSTECTOMY: SHX55

## 2020-04-04 DIAGNOSIS — M25572 Pain in left ankle and joints of left foot: Secondary | ICD-10-CM | POA: Diagnosis not present

## 2020-04-04 DIAGNOSIS — Z6827 Body mass index (BMI) 27.0-27.9, adult: Secondary | ICD-10-CM | POA: Diagnosis not present

## 2020-04-04 DIAGNOSIS — Z3200 Encounter for pregnancy test, result unknown: Secondary | ICD-10-CM | POA: Diagnosis not present

## 2020-04-04 DIAGNOSIS — S93402A Sprain of unspecified ligament of left ankle, initial encounter: Secondary | ICD-10-CM | POA: Diagnosis not present

## 2020-04-13 ENCOUNTER — Ambulatory Visit: Payer: BC Managed Care – PPO | Admitting: Obstetrics and Gynecology

## 2020-04-22 DIAGNOSIS — R300 Dysuria: Secondary | ICD-10-CM | POA: Diagnosis not present

## 2020-04-22 DIAGNOSIS — R197 Diarrhea, unspecified: Secondary | ICD-10-CM | POA: Diagnosis not present

## 2020-04-22 DIAGNOSIS — K219 Gastro-esophageal reflux disease without esophagitis: Secondary | ICD-10-CM | POA: Diagnosis not present

## 2020-04-22 DIAGNOSIS — G43919 Migraine, unspecified, intractable, without status migrainosus: Secondary | ICD-10-CM | POA: Diagnosis not present

## 2020-04-22 DIAGNOSIS — Z3200 Encounter for pregnancy test, result unknown: Secondary | ICD-10-CM | POA: Diagnosis not present

## 2020-05-13 ENCOUNTER — Ambulatory Visit: Payer: BC Managed Care – PPO | Admitting: Obstetrics and Gynecology

## 2020-06-02 ENCOUNTER — Ambulatory Visit: Payer: BC Managed Care – PPO | Admitting: Obstetrics and Gynecology

## 2020-07-28 ENCOUNTER — Ambulatory Visit: Payer: BC Managed Care – PPO | Admitting: Obstetrics and Gynecology

## 2020-09-09 ENCOUNTER — Ambulatory Visit: Payer: Self-pay | Admitting: Family Medicine

## 2020-09-30 ENCOUNTER — Ambulatory Visit: Payer: Self-pay | Admitting: Family Medicine

## 2021-02-07 NOTE — Progress Notes (Deleted)
25 y.o. G0P0000 Single Hispanic female here for annual exam.    PCP:     No LMP recorded.           Sexually active: {yes no:314532}  The current method of family planning is ***condoms sometimes.    Exercising: {yes no:314532}  {types:19826} Smoker:  no  Health Maintenance: Pap:  08-26-17 Neg History of abnormal Pap:  no MMG:  n/a Colonoscopy:  n/a BMD:   n/a  Result  n/a TDaP:  Unsure Gardasil:   yes HIV: 10-09-19 NR Hep C: 10-09-19 Neg Screening Labs:  Hb today: ***, Urine today: ***   reports that she has never smoked. She has never used smokeless tobacco. She reports that she does not drink alcohol and does not use drugs.  Past Medical History:  Diagnosis Date   Chlamydia 07/2016   Elevated liver function tests 2019   elevated AST, ALT   GERD (gastroesophageal reflux disease) 11/2019    Past Surgical History:  Procedure Laterality Date   WISDOM TOOTH EXTRACTION  05/27/2017    Current Outpatient Medications  Medication Sig Dispense Refill   medroxyPROGESTERone (PROVERA) 10 MG tablet Take 1 tablet (10 mg total) by mouth daily. Take for 10 days every month. 30 tablet 3   pantoprazole (PROTONIX) 40 MG tablet Take 1 tablet by mouth daily. (Patient not taking: Reported on 12/30/2019)     sucralfate (CARAFATE) 1 g tablet Take 1 g by mouth 4 (four) times daily. (Patient not taking: Reported on 12/30/2019)     No current facility-administered medications for this visit.    Family History  Problem Relation Age of Onset   Anemia Mother    Thyroid disease Mother    Diabetes Father    Diabetes Maternal Grandmother     Review of Systems  Exam:   There were no vitals taken for this visit.    General appearance: alert, cooperative and appears stated age Head: normocephalic, without obvious abnormality, atraumatic Neck: no adenopathy, supple, symmetrical, trachea midline and thyroid normal to inspection and palpation Lungs: clear to auscultation bilaterally Breasts:  normal appearance, no masses or tenderness, No nipple retraction or dimpling, No nipple discharge or bleeding, No axillary adenopathy Heart: regular rate and rhythm Abdomen: soft, non-tender; no masses, no organomegaly Extremities: extremities normal, atraumatic, no cyanosis or edema Skin: skin color, texture, turgor normal. No rashes or lesions Lymph nodes: cervical, supraclavicular, and axillary nodes normal. Neurologic: grossly normal  Pelvic: External genitalia:  no lesions              No abnormal inguinal nodes palpated.              Urethra:  normal appearing urethra with no masses, tenderness or lesions              Bartholins and Skenes: normal                 Vagina: normal appearing vagina with normal color and discharge, no lesions              Cervix: no lesions              Pap taken: {yes no:314532} Bimanual Exam:  Uterus:  normal size, contour, position, consistency, mobility, non-tender              Adnexa: no mass, fullness, tenderness              Rectal exam: {yes no:314532}.  Confirms.  Anus:  normal sphincter tone, no lesions  Chaperone was present for exam:  ***  Assessment:   Well woman visit with gynecologic exam.   Plan: Mammogram screening discussed. Self breast awareness reviewed. Pap and HR HPV as above. Guidelines for Calcium, Vitamin D, regular exercise program including cardiovascular and weight bearing exercise.   Follow up annually and prn.   Additional counseling given.  {yes [id number]. _______ minutes face to face time of which over 50% was spent in counseling.    After visit summary provided.

## 2021-02-08 ENCOUNTER — Ambulatory Visit: Payer: BC Managed Care – PPO | Admitting: Obstetrics and Gynecology

## 2021-03-16 ENCOUNTER — Other Ambulatory Visit (HOSPITAL_COMMUNITY)
Admission: RE | Admit: 2021-03-16 | Discharge: 2021-03-16 | Disposition: A | Discharge status: Home / Self Care | Payer: Managed Care, Other (non HMO) | Source: Ambulatory Visit | Attending: Obstetrics and Gynecology | Admitting: Obstetrics and Gynecology

## 2021-03-16 ENCOUNTER — Encounter: Payer: Self-pay | Admitting: Obstetrics and Gynecology

## 2021-03-16 ENCOUNTER — Other Ambulatory Visit: Payer: Self-pay

## 2021-03-16 ENCOUNTER — Ambulatory Visit (INDEPENDENT_AMBULATORY_CARE_PROVIDER_SITE_OTHER): Payer: Managed Care, Other (non HMO) | Admitting: Obstetrics and Gynecology

## 2021-03-16 VITALS — BP 100/58 | HR 97 | Ht 61.5 in | Wt 132.0 lb

## 2021-03-16 DIAGNOSIS — Z119 Encounter for screening for infectious and parasitic diseases, unspecified: Secondary | ICD-10-CM | POA: Diagnosis not present

## 2021-03-16 DIAGNOSIS — Z124 Encounter for screening for malignant neoplasm of cervix: Secondary | ICD-10-CM | POA: Diagnosis present

## 2021-03-16 DIAGNOSIS — Z113 Encounter for screening for infections with a predominantly sexual mode of transmission: Secondary | ICD-10-CM | POA: Diagnosis present

## 2021-03-16 DIAGNOSIS — Z01419 Encounter for gynecological examination (general) (routine) without abnormal findings: Secondary | ICD-10-CM | POA: Diagnosis not present

## 2021-03-16 DIAGNOSIS — Z0000 Encounter for general adult medical examination without abnormal findings: Secondary | ICD-10-CM

## 2021-03-16 NOTE — Patient Instructions (Signed)

## 2021-03-16 NOTE — Progress Notes (Signed)
26 y.o. G23P0000 Single Hispanic female here for annual exam.    No menses July, August or September of 2022. Would like to know if she should check hormone levels. Now having menses monthly spontaneously.   Some cramping for the first two days of her cycle.  Tylenol helps.   No partner change.   Labs 10/09/19: Estradiol 34.2 Prolactin 13.3 LH 22.6 FSH 8.7 Celiac negative  Had gall bladder removed.  Abdominal pain now gone.   She does have some pain in the midline lower abdomen with sex.   Declines prescription contraceptive.   PCP:   None  Patient's last menstrual period was 03/13/2021 (exact date).     Period Duration (Days): 6-7 Period Pattern: (!) Irregular Menstrual Flow: Moderate Menstrual Control: Maxi pad Menstrual Control Change Freq (Hours): changes maxi pad every 1.5 to 2 hours on heaviest day Dysmenorrhea: (!) Moderate Dysmenorrhea Symptoms: Cramping, Headache     Sexually active: Yes.    The current method of family planning is condoms.    Exercising: Yes.     Cardio, treadmill and weights Smoker:  no  Health Maintenance: Pap: 08-26-17 Neg  History of abnormal Pap:  no MMG:  n/a Colonoscopy:  n/a BMD:   n/a  Result  n/a TDaP:  up to date Gardasil:   yes, completed HIV: 10-09-19 NR Hep C: 10-09-19 Neg Screening Labs:  today Flu vaccine:  completed.   reports that she has never smoked. She has never used smokeless tobacco. She reports that she does not drink alcohol and does not use drugs.  Past Medical History:  Diagnosis Date   Chlamydia 07/2016   Elevated liver function tests 2019   elevated AST, ALT   GERD (gastroesophageal reflux disease) 11/2019    Past Surgical History:  Procedure Laterality Date   WISDOM TOOTH EXTRACTION  05/27/2017    No current outpatient medications on file.   No current facility-administered medications for this visit.    Family History  Problem Relation Age of Onset   Anemia Mother    Thyroid disease Mother     Diabetes Father    Diabetes Maternal Grandmother     Review of Systems  All other systems reviewed and are negative.  Exam:   BP (!) 100/58    Pulse 97    Ht 5' 1.5" (1.562 m)    Wt 132 lb (59.9 kg)    LMP 03/13/2021 (Exact Date)    SpO2 98%    BMI 24.54 kg/m     General appearance: alert, cooperative and appears stated age Head: normocephalic, without obvious abnormality, atraumatic Neck: no adenopathy, supple, symmetrical, trachea midline and thyroid normal to inspection and palpation Lungs: clear to auscultation bilaterally Breasts: normal appearance, no masses or tenderness, No nipple retraction or dimpling, No nipple discharge or bleeding, No axillary adenopathy Heart: regular rate and rhythm Abdomen: soft, non-tender; no masses, no organomegaly Extremities: extremities normal, atraumatic, no cyanosis or edema Skin: skin color, texture, turgor normal. No rashes or lesions Lymph nodes: cervical, supraclavicular, and axillary nodes normal. Neurologic: grossly normal  Pelvic: External genitalia:  no lesions              No abnormal inguinal nodes palpated.              Urethra:  normal appearing urethra with no masses, tenderness or lesions              Bartholins and Skenes: normal  Vagina: normal appearing vagina with normal color and discharge, no lesions              Cervix: no lesions.  Menstrual flow noted.               Pap taken: yes Bimanual Exam:  Uterus:  normal size, contour, position, consistency, mobility, non-tender              Adnexa: no mass, fullness, tenderness        Chaperone was present for exam:  Marchelle Folks, CMA  Assessment:   Well woman visit with gynecologic exam. STD screening.  Infectious disease screening.  Cervical cancer screening.  Routine labs as part of annual exam.  Dyspareunia.  Plan: Mammogram screening discussed. Self breast awareness reviewed. Pap and reflex HR HPV.  GC/CT/trich/HIV/RPR/hep C aby.  Cholesterol,  CMP, CBC. Guidelines for Calcium, Vitamin D, regular exercise program including cardiovascular and weight bearing exercise. Patient will monitor the dyspareunia and if persistent, she will call to schedule a pelvic US. Follow up annually and prn.   After visit summary provided.

## 2021-03-17 LAB — LIPID PANEL
Cholesterol: 195 mg/dL (ref <200)
HDL: 72 mg/dL (ref >=50)
LDL Cholesterol (Calc): 100 mg/dL (calc) — ABNORMAL HIGH
Non-HDL Cholesterol (Calc): 123 mg/dL (calc) (ref <130)
Total CHOL/HDL Ratio: 2.7 (calc) (ref <5.0)
Triglycerides: 134 mg/dL (ref <150)

## 2021-03-17 LAB — COMPREHENSIVE METABOLIC PANEL
AG Ratio: 1.5 (calc) (ref 1.0–2.5)
ALT: 49 U/L — ABNORMAL HIGH (ref 6–29)
AST: 29 U/L (ref 10–30)
Albumin: 4.6 g/dL (ref 3.6–5.1)
Alkaline phosphatase (APISO): 70 U/L (ref 31–125)
BUN: 15 mg/dL (ref 7–25)
CO2: 29 mmol/L (ref 20–32)
Calcium: 9.8 mg/dL (ref 8.6–10.2)
Chloride: 105 mmol/L (ref 98–110)
Creat: 0.57 mg/dL (ref 0.50–0.96)
Globulin: 3.1 g/dL (calc) (ref 1.9–3.7)
Glucose, Bld: 72 mg/dL (ref 65–99)
Potassium: 4 mmol/L (ref 3.5–5.3)
Sodium: 140 mmol/L (ref 135–146)
Total Bilirubin: 0.3 mg/dL (ref 0.2–1.2)
Total Protein: 7.7 g/dL (ref 6.1–8.1)

## 2021-03-17 LAB — CBC
HCT: 40.8 % (ref 35.0–45.0)
Hemoglobin: 13.7 g/dL (ref 11.7–15.5)
MCH: 30.9 pg (ref 27.0–33.0)
MCHC: 33.6 g/dL (ref 32.0–36.0)
MCV: 92.1 fL (ref 80.0–100.0)
MPV: 11.2 fL (ref 7.5–12.5)
Platelets: 274 10*3/uL (ref 140–400)
RBC: 4.43 10*6/uL (ref 3.80–5.10)
RDW: 12.2 % (ref 11.0–15.0)
WBC: 6.5 10*3/uL (ref 3.8–10.8)

## 2021-03-17 LAB — CYTOLOGY - PAP
Chlamydia: NEGATIVE
Comment: NEGATIVE
Comment: NEGATIVE
Comment: NEGATIVE
Comment: NORMAL
Diagnosis: NEGATIVE
High risk HPV: NEGATIVE
Neisseria Gonorrhea: NEGATIVE
Trichomonas: NEGATIVE

## 2021-03-17 LAB — HEPATITIS C ANTIBODY
Hepatitis C Ab: NONREACTIVE
SIGNAL TO CUT-OFF: 0.1 (ref <1.00)

## 2021-03-17 LAB — HIV ANTIBODY (ROUTINE TESTING W REFLEX): HIV 1&2 Ab, 4th Generation: NONREACTIVE

## 2021-03-17 LAB — RPR: RPR Ser Ql: NONREACTIVE

## 2021-03-23 ENCOUNTER — Ambulatory Visit: Payer: Self-pay

## 2021-03-23 ENCOUNTER — Other Ambulatory Visit: Payer: Self-pay | Admitting: Occupational Medicine

## 2021-03-23 ENCOUNTER — Other Ambulatory Visit: Payer: Self-pay

## 2021-03-23 DIAGNOSIS — M25512 Pain in left shoulder: Secondary | ICD-10-CM

## 2021-04-27 ENCOUNTER — Ambulatory Visit: Payer: Self-pay | Admitting: Obstetrics and Gynecology

## 2022-03-07 NOTE — Progress Notes (Incomplete)
27 y.o. G0P0000 Single {Race/ethnicity:17218} female here for annual exam.    PCP:     No LMP recorded.           Sexually active: {yes no:314532}  The current method of family planning is {contraception:315051}.    Exercising: {yes no:314532}  {types:19826} Smoker:  {YES NO:22349}  Health Maintenance: Pap:  *** History of abnormal Pap:  {YES NO:22349} MMG:  n/a Colonoscopy:  n/a BMD:   n/a  Result  n/a TDaP:  *** Gardasil:   {YES NO:22349} HIV: Hep C: Screening Labs:  Hb today: ***, Urine today: ***   reports that she has never smoked. She has never used smokeless tobacco. She reports that she does not drink alcohol and does not use drugs.  Past Medical History:  Diagnosis Date  . Chlamydia 07/2016  . Elevated liver function tests 2019   elevated AST, ALT  . GERD (gastroesophageal reflux disease) 11/2019    Past Surgical History:  Procedure Laterality Date  . WISDOM TOOTH EXTRACTION  05/27/2017    No current outpatient medications on file.   No current facility-administered medications for this visit.    Family History  Problem Relation Age of Onset  . Anemia Mother   . Thyroid disease Mother   . Diabetes Father   . Diabetes Maternal Grandmother     Review of Systems  Exam:   There were no vitals taken for this visit.    General appearance: alert, cooperative and appears stated age Head: normocephalic, without obvious abnormality, atraumatic Neck: no adenopathy, supple, symmetrical, trachea midline and thyroid normal to inspection and palpation Lungs: clear to auscultation bilaterally Breasts: normal appearance, no masses or tenderness, No nipple retraction or dimpling, No nipple discharge or bleeding, No axillary adenopathy Heart: regular rate and rhythm Abdomen: soft, non-tender; no masses, no organomegaly Extremities: extremities normal, atraumatic, no cyanosis or edema Skin: skin color, texture, turgor normal. No rashes or lesions Lymph nodes:  cervical, supraclavicular, and axillary nodes normal. Neurologic: grossly normal  Pelvic: External genitalia:  no lesions              No abnormal inguinal nodes palpated.              Urethra:  normal appearing urethra with no masses, tenderness or lesions              Bartholins and Skenes: normal                 Vagina: normal appearing vagina with normal color and discharge, no lesions              Cervix: no lesions              Pap taken: {yes no:314532} Bimanual Exam:  Uterus:  normal size, contour, position, consistency, mobility, non-tender              Adnexa: no mass, fullness, tenderness              Rectal exam: {yes no:314532}.  Confirms.              Anus:  normal sphincter tone, no lesions  Chaperone was present for exam:  ***  Assessment:   Well woman visit with gynecologic exam.   Plan: Mammogram screening discussed. Self breast awareness reviewed. Pap and HR HPV as above. Guidelines for Calcium, Vitamin D, regular exercise program including cardiovascular and weight bearing exercise.   Follow up annually and prn.   Additional counseling given.  {  yes no:314532}. _______ minutes face to face time of which over 50% was spent in counseling.    After visit summary provided.

## 2022-03-17 IMAGING — DX DG SHOULDER 2+V*L*
3 series · 3 of 3 positions shown · non-contrast
Comparison: None.

CLINICAL DATA: Pain left shoulder

EXAM:
LEFT SHOULDER - 2+ VIEW

[shoulder grashey]
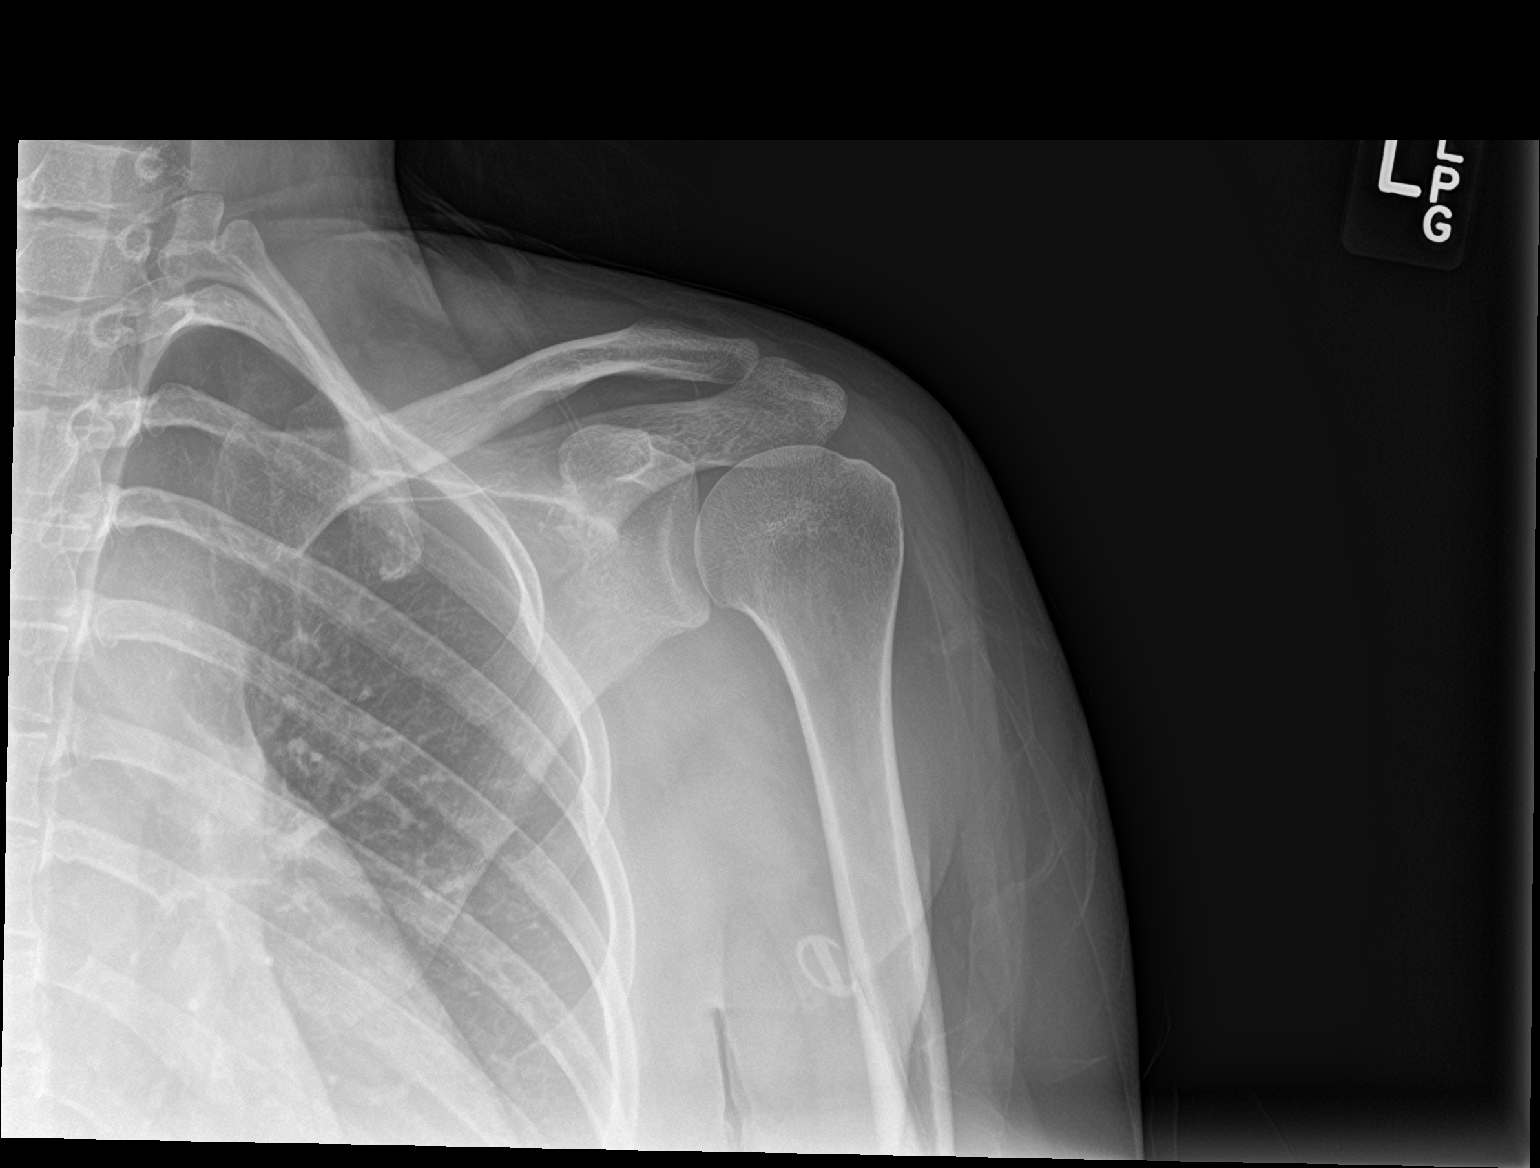

[shoulder y-view]
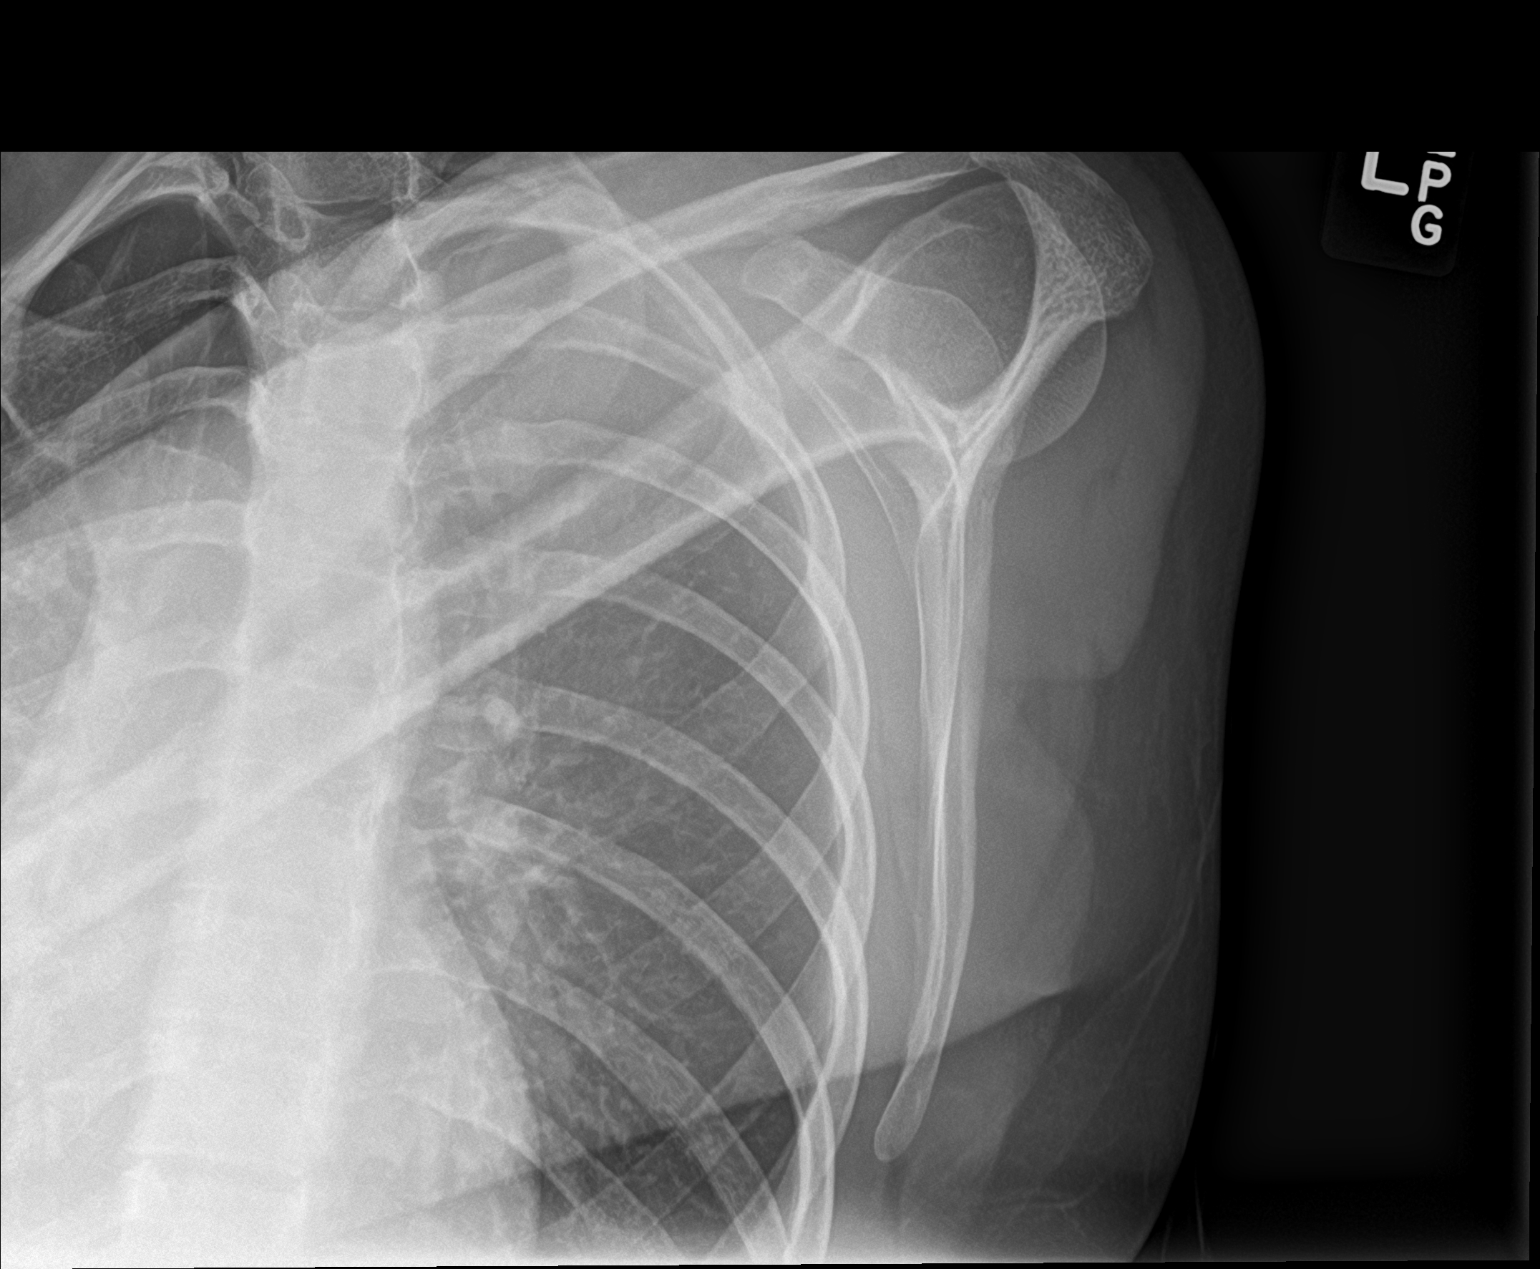

[shoulder axial]
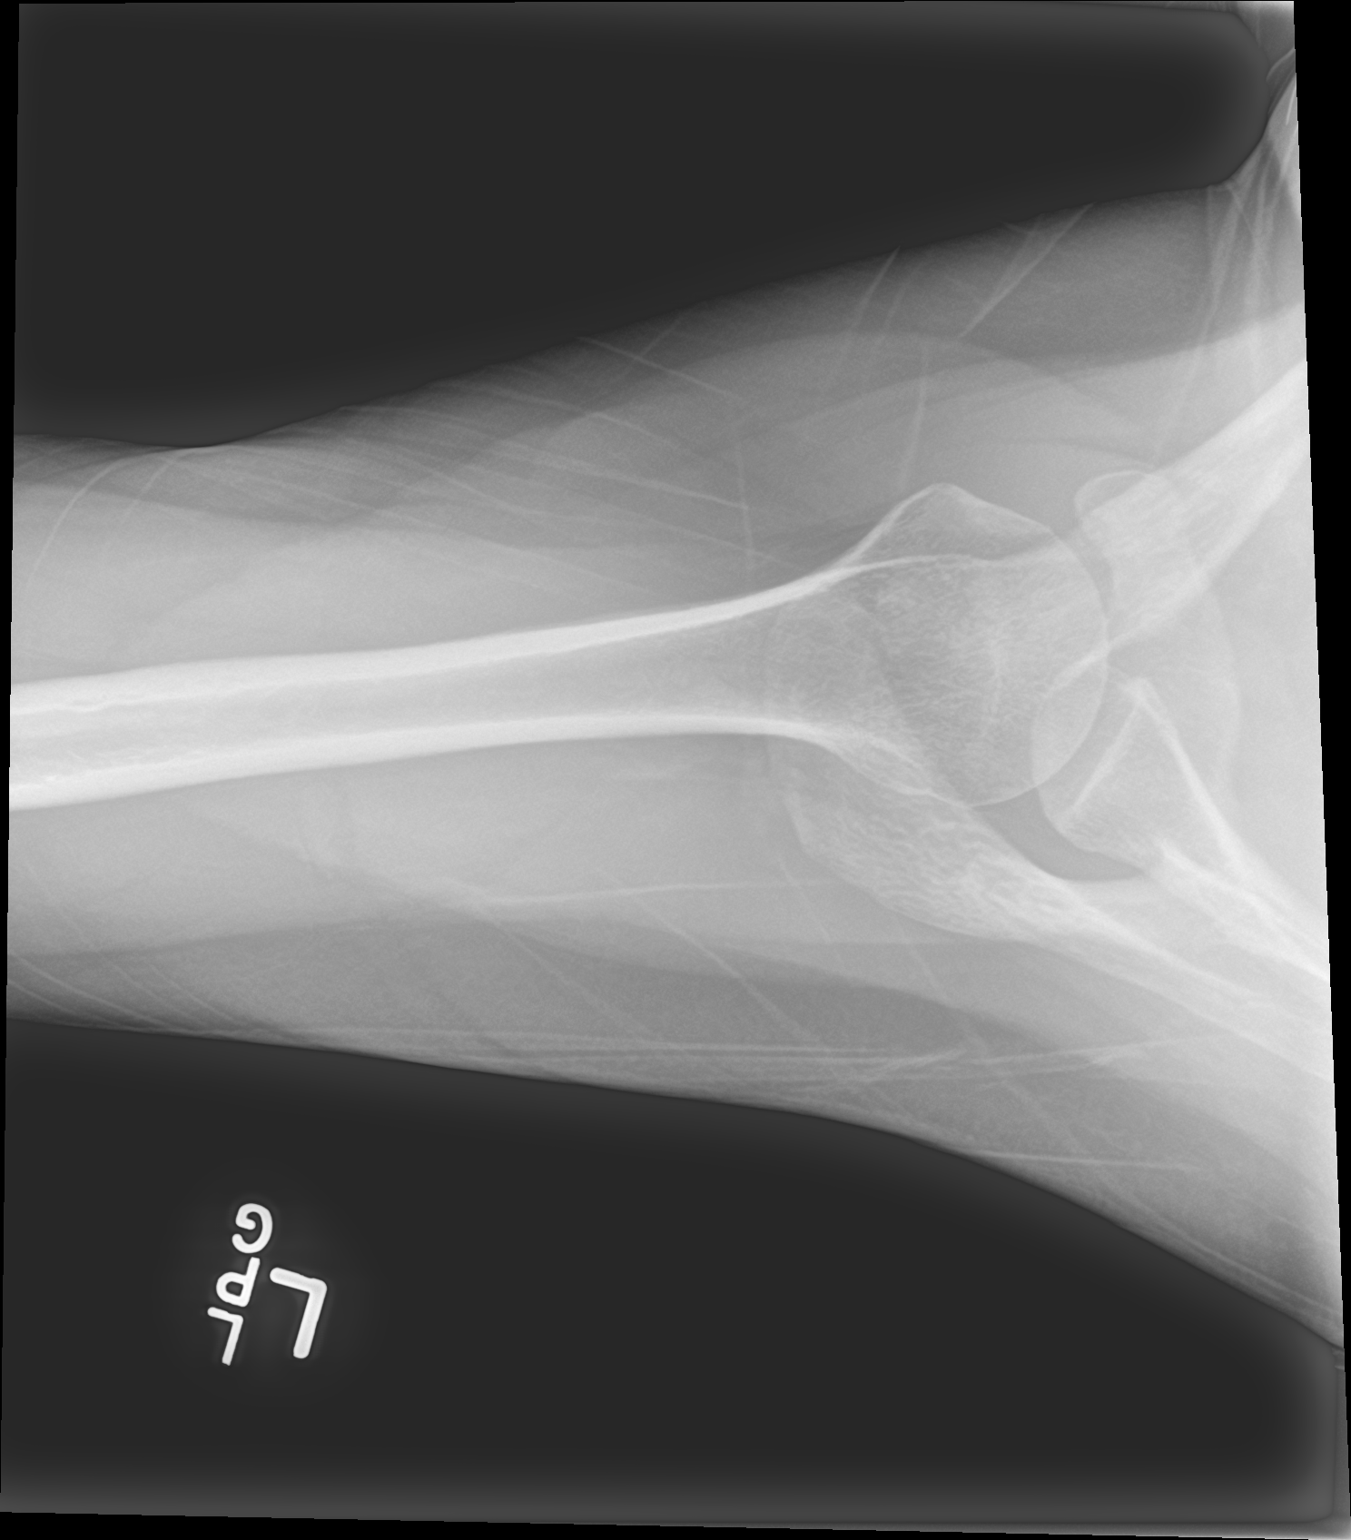

[3 of 3 positions shown; findings below may reference images not displayed]

FINDINGS: There is no evidence of fracture or dislocation. There is no
evidence of arthropathy or other focal bone abnormality. Soft
tissues are unremarkable.
IMPRESSION: Negative.

## 2022-03-21 ENCOUNTER — Ambulatory Visit: Payer: Managed Care, Other (non HMO) | Admitting: Obstetrics and Gynecology

## 2022-04-11 NOTE — Progress Notes (Deleted)
27 y.o. G0P0000 Single Hispanic female here for annual exam.    PCP:     No LMP recorded.           Sexually active: {yes no:314532}  The current method of family planning is condoms .    Exercising: {yes no:314532}  {types:19826} Smoker:  no  Health Maintenance: Pap:  03/16/21 neg: HR HPV neg, 08/26/17 neg: HR HPV neg History of abnormal Pap:  no MMG:  n/a Colonoscopy:  n/a BMD:   n/a  Result  n/a TDaP:  up to date Gardasil:   yes HIV: 10/09/19 NR Hep C: 10/09/19 Neg Screening Labs:  Hb today: ***, Urine today: ***   reports that she has never smoked. She has never used smokeless tobacco. She reports that she does not drink alcohol and does not use drugs.  Past Medical History:  Diagnosis Date   Chlamydia 07/2016   Elevated liver function tests 2019   elevated AST, ALT   GERD (gastroesophageal reflux disease) 11/2019    Past Surgical History:  Procedure Laterality Date   WISDOM TOOTH EXTRACTION  05/27/2017    No current outpatient medications on file.   No current facility-administered medications for this visit.    Family History  Problem Relation Age of Onset   Anemia Mother    Thyroid disease Mother    Diabetes Father    Diabetes Maternal Grandmother     Review of Systems  Exam:   There were no vitals taken for this visit.    General appearance: alert, cooperative and appears stated age Head: normocephalic, without obvious abnormality, atraumatic Neck: no adenopathy, supple, symmetrical, trachea midline and thyroid normal to inspection and palpation Lungs: clear to auscultation bilaterally Breasts: normal appearance, no masses or tenderness, No nipple retraction or dimpling, No nipple discharge or bleeding, No axillary adenopathy Heart: regular rate and rhythm Abdomen: soft, non-tender; no masses, no organomegaly Extremities: extremities normal, atraumatic, no cyanosis or edema Skin: skin color, texture, turgor normal. No rashes or lesions Lymph nodes:  cervical, supraclavicular, and axillary nodes normal. Neurologic: grossly normal  Pelvic: External genitalia:  no lesions              No abnormal inguinal nodes palpated.              Urethra:  normal appearing urethra with no masses, tenderness or lesions              Bartholins and Skenes: normal                 Vagina: normal appearing vagina with normal color and discharge, no lesions              Cervix: no lesions              Pap taken: {yes no:314532} Bimanual Exam:  Uterus:  normal size, contour, position, consistency, mobility, non-tender              Adnexa: no mass, fullness, tenderness              Rectal exam: {yes no:314532}.  Confirms.              Anus:  normal sphincter tone, no lesions  Chaperone was present for exam:  ***  Assessment:   Well woman visit with gynecologic exam.   Plan: Mammogram screening discussed. Self breast awareness reviewed. Pap and HR HPV as above. Guidelines for Calcium, Vitamin D, regular exercise program including cardiovascular and weight bearing exercise.  Follow up annually and prn.   Additional counseling given.  {yes [id number]. _______ minutes face to face time of which over 50% was spent in counseling.    After visit summary provided.

## 2022-04-24 ENCOUNTER — Ambulatory Visit: Payer: Managed Care, Other (non HMO) | Admitting: Obstetrics and Gynecology

## 2022-05-21 ENCOUNTER — Ambulatory Visit: Payer: Managed Care, Other (non HMO) | Attending: Medical

## 2022-05-21 ENCOUNTER — Other Ambulatory Visit: Payer: Self-pay

## 2022-05-21 DIAGNOSIS — M62838 Other muscle spasm: Secondary | ICD-10-CM

## 2022-05-21 DIAGNOSIS — M25612 Stiffness of left shoulder, not elsewhere classified: Secondary | ICD-10-CM | POA: Diagnosis present

## 2022-05-21 DIAGNOSIS — R519 Headache, unspecified: Secondary | ICD-10-CM | POA: Insufficient documentation

## 2022-05-21 DIAGNOSIS — M436 Torticollis: Secondary | ICD-10-CM

## 2022-05-21 DIAGNOSIS — G8929 Other chronic pain: Secondary | ICD-10-CM | POA: Diagnosis present

## 2022-05-21 NOTE — Therapy (Signed)
OUTPATIENT PHYSICAL THERAPY CERVICAL EVALUATION   Patient Name: Erin Duncan MRN: [id number] DOB:[birth year], 27 y.o., female Today's Date: 05/21/2022  END OF SESSION:  PT End of Session - 05/21/22 1556     Visit Number 1    Date for PT Re-Evaluation 08/13/22    PT Start Time 1600    PT Stop Time 1645    PT Time Calculation (min) 45 min    Activity Tolerance Patient tolerated treatment well    Behavior During Therapy [facility] for tasks assessed/performed             Past Medical History:  Diagnosis Date   Chlamydia 07/2016   Elevated liver function tests 2019   elevated AST, ALT   GERD (gastroesophageal reflux disease) 11/2019   Past Surgical History:  Procedure Laterality Date   WISDOM TOOTH EXTRACTION  05/27/2017   Patient Active Problem List   Diagnosis Date Noted   Irregular menses 08/24/2016    PCP: none  REFERRING PROVIDER: Brantley Stage A  REFERRING DIAG: head and neck muscle spasm  THERAPY DIAG:  Neck muscle spasm  Chronic left-sided headaches  Stiffness of cervical spine  Shoulder stiffness, left  Rationale for Evaluation and Treatment: Rehabilitation  ONSET DATE: 6 /2022  SUBJECTIVE:                                                                                                                                                                                                         SUBJECTIVE STATEMENT: 3x a week headache or neck pain, can't sleep well, can't sleep on side, due to L shoulder pain, so doesn't sleep well with the neck pain .  Also notes some blurred vision Hand dominance: Right  PERTINENT HISTORY:  Injured L shoulder , had tetanus shot, had reaction to the tetanus shot , then oral steroids, then frozen shoulder  PAIN:  Are you having pain? Yes: Pain location: suboccipital region, primarily on L Pain description: L suboccipital extends across to R Aggravating factors: sleeping  Relieving factors: meds, uses pillow  roll under base of skull  PRECAUTIONS: None  WEIGHT BEARING RESTRICTIONS: No  FALLS:  Has patient fallen in last 6 months? 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  PLOF: Independent  PATIENT GOALS: to be able to sleep  NEXT MD VISIT: unknown  OBJECTIVE:   DIAGNOSTIC FINDINGS:  None of c spine per patients  PATIENT SURVEYS:  NDI 13/50  COGNITION: Overall cognitive status: Within functional limits for tasks assessed  SENSATION: WFL  POSTURE: rounded shoulders and forward head  PALPATION: Point tender suboccipital musculature, decreased mobility for L to R side glides c spine   CERVICAL ROM:   Active ROM A/PROM (deg) eval  Flexion wfl  Extension wnl  Right lateral flexion 60  Left lateral flexion 60  Right rotation 70  Left rotation wnl   (Blank rows = not tested)  UPPER EXTREMITY ROM:all ROM WNL unless otherwise noted  Active ROM Right eval Left eval  Shoulder flexion  130  Shoulder extension    Shoulder abduction  130  Shoulder adduction    Shoulder extension    Shoulder internal rotation  wnl  Shoulder external rotation    Elbow flexion    Elbow extension    Wrist flexion    Wrist extension    Wrist ulnar deviation    Wrist radial deviation    Wrist pronation    Wrist supination     (Blank rows = not tested)  UPPER EXTREMITY MMT:  MMT Right eval Left eval  Shoulder flexion  130  Shoulder extension    Shoulder abduction  130  Shoulder adduction    Shoulder extension    Shoulder internal rotation    Shoulder external rotation    Middle trapezius    Lower trapezius    Elbow flexion    Elbow extension    Wrist flexion    Wrist extension    Wrist ulnar deviation    Wrist radial deviation    Wrist pronation    Wrist supination    Grip strength     (Blank  rows = not tested)  CERVICAL SPECIAL TESTS:  Cranial cervical flexion test: Positive, Spurling's test: Positive, and Distraction test: Positive    TODAY'S TREATMENT:                                                                                                                              DATE: 05/21/22 Manual:  HVLA T 4, and R C 2 PA, interspersed with gentle manual cervical distraction. Contract /relax for R upper cervical rotation, 5 sec holds, 5 reps Instructed in SNAGs for upper cervical R rotation and in B shoulder t band ER Also instructed in possible adaptations to her work chair.   PATIENT EDUCATION:  Education details: POC, goals  Person educated: Patient Education method: Explanation, Demonstration, Tactile cues, Verbal cues, and Handouts Education comprehension: verbalized understanding, returned demonstration, verbal cues required, tactile cues required, and needs further education  HOME EXERCISE PROGRAM: Access Code: [email or web address] URL: [email or web address] Date: 05/21/2022 Prepared by: Shalaina Guardiola  Exercises - Upper Cervical Rotation SNAG with Strap  - 1 x daily - 7 x weekly - 3 sets - 10 reps  ASSESSMENT:  CLINICAL IMPRESSION: Patient is a 27 y.o. female  who was seen today for physical therapy evaluation and treatment for chronic cervical spine  pain and headaches.  She presents with limited upper cervical rotation to the R, and tender, thickened upper cervical musculature. The headaches are contributing to sleep interruption.  She also has some residual dysfunction L shoulder due to another injury 2 years ago.  Will benefit from skilled PT to address he cervical spine dysfunction and also address postural/ scapular musculature strenthening.  OBJECTIVE IMPAIRMENTS: decreased knowledge of condition, decreased ROM, impaired flexibility, and pain.   ACTIVITY LIMITATIONS: sleeping and reach over head  PARTICIPATION LIMITATIONS: driving and community  activity  PERSONAL FACTORS: Time since onset of injury/illness/exacerbation and 1 comorbidity: h/o L shoulder adhesive capsulitis  are also affecting patient's functional outcome.   REHAB POTENTIAL: Good  CLINICAL DECISION MAKING: Stable/uncomplicated  EVALUATION COMPLEXITY: Low   GOALS: Goals reviewed with patient? Yes  SHORT TERM GOALS: Target date: 06/04/22  I HEP Baseline: established initial eval Goal status: INITIAL   LONG TERM GOALS: Target date: 08/13/22  Improve ROM cervical spine for R rotation from 60 to 100% Baseline: 60% with upper cervical pain Goal status: INITIAL  2.  Reduce frequency of headaches from 3 x weekly to 1 x weekly or less Baseline: 3 x week Goal status: INITIAL  3.  Improve NDI score to 5/50 Baseline: 13/50 Goal status: INITIAL    PLAN:  PT FREQUENCY: 1x/week  PT DURATION: 12 weeks  PLANNED INTERVENTIONS: Therapeutic exercises, Therapeutic activity, Neuromuscular re-education, Balance training, Gait training, Patient/Family education, Self Care, Joint mobilization, Dry Needling, Electrical stimulation, and Spinal mobilization  PLAN FOR NEXT SESSION: how did HVLA work? Recheck upper cervical ROM, possibly dry needling, progress with postural education strengthening   Carlton Sweaney L Sathvik Tiedt, PT 05/21/2022, 5:22 PM

## 2022-05-31 ENCOUNTER — Ambulatory Visit: Payer: Managed Care, Other (non HMO) | Admitting: Physical Therapy

## 2022-06-08 ENCOUNTER — Ambulatory Visit: Payer: Managed Care, Other (non HMO) | Admitting: Physical Therapy

## 2022-06-11 NOTE — Addendum Note (Signed)
Addended byMaxcine Ham, Nalina Yeatman L on: 06/11/2022 10:29 AM   Modules accepted: Orders

## 2022-06-14 ENCOUNTER — Ambulatory Visit: Payer: Managed Care, Other (non HMO) | Admitting: Physical Therapy

## 2022-06-20 NOTE — Progress Notes (Deleted)
27 y.o. G0P0000 Single Hispanic female here for annual exam.    PCP:     No LMP recorded.           Sexually active: {yes no:314532}  The current method of family planning is condoms .    Exercising: {yes no:314532}  {types:19826} Smoker:  no  Health Maintenance: Pap:  03/16/21 neg: HR HPV neg, 08/26/17 neg History of abnormal Pap:  no MMG:  n/a Colonoscopy:  n/a BMD:   n/a  Result  n/a TDaP:  up to date Gardasil:   no HIV: 03/16/21 NR Hep C: 03/16/21 NR Screening Labs:  Hb today: ***, Urine today: ***   reports that she has never smoked. She has never used smokeless tobacco. She reports that she does not drink alcohol and does not use drugs.  Past Medical History:  Diagnosis Date   Chlamydia 07/2016   Elevated liver function tests 2019   elevated AST, ALT   GERD (gastroesophageal reflux disease) 11/2019    Past Surgical History:  Procedure Laterality Date   WISDOM TOOTH EXTRACTION  05/27/2017    No current outpatient medications on file.   No current facility-administered medications for this visit.    Family History  Problem Relation Age of Onset   Anemia Mother    Thyroid disease Mother    Diabetes Father    Diabetes Maternal Grandmother     Review of Systems  Exam:   There were no vitals taken for this visit.    General appearance: alert, cooperative and appears stated age Head: normocephalic, without obvious abnormality, atraumatic Neck: no adenopathy, supple, symmetrical, trachea midline and thyroid normal to inspection and palpation Lungs: clear to auscultation bilaterally Breasts: normal appearance, no masses or tenderness, No nipple retraction or dimpling, No nipple discharge or bleeding, No axillary adenopathy Heart: regular rate and rhythm Abdomen: soft, non-tender; no masses, no organomegaly Extremities: extremities normal, atraumatic, no cyanosis or edema Skin: skin color, texture, turgor normal. No rashes or lesions Lymph nodes: cervical,  supraclavicular, and axillary nodes normal. Neurologic: grossly normal  Pelvic: External genitalia:  no lesions              No abnormal inguinal nodes palpated.              Urethra:  normal appearing urethra with no masses, tenderness or lesions              Bartholins and Skenes: normal                 Vagina: normal appearing vagina with normal color and discharge, no lesions              Cervix: no lesions              Pap taken: {yes no:314532} Bimanual Exam:  Uterus:  normal size, contour, position, consistency, mobility, non-tender              Adnexa: no mass, fullness, tenderness              Rectal exam: {yes no:314532}.  Confirms.              Anus:  normal sphincter tone, no lesions  Chaperone was present for exam:  ***  Assessment:   Well woman visit with gynecologic exam.   Plan: Mammogram screening discussed. Self breast awareness reviewed. Pap and HR HPV as above. Guidelines for Calcium, Vitamin D, regular exercise program including cardiovascular and weight bearing exercise.   Follow  up annually and prn.   Additional counseling given.  {yes [id number]. _______ minutes face to face time of which over 50% was spent in counseling.    After visit summary provided.

## 2022-06-21 ENCOUNTER — Ambulatory Visit: Payer: Managed Care, Other (non HMO) | Admitting: Physical Therapy

## 2022-06-27 NOTE — Progress Notes (Signed)
27 y.o. G12P0000 Married Hispanic female here for annual exam.    Can skip 1 - 2 months of her menstrual cycle.  In the last year, she has had 8 - 9 menses.   When she eats better and exercises, she has more regular cycles.  This can be associated with weight loss.   She notes some facial hair.  Does threading on the sides of her face and removes occasional chin hair. Has chin acne.  Notes some fatigue.  FH hypothyroidism.   Pelvic US 2021 suggestive of PCOS. Prior LH>FSH ratio suggestive of PCOS in 2021. Normal TSH and prolactin then.   Took LoEstrin in the post.  No problems.   Has migraine HA.  No aura.  Believes she had STD testing with [facility] this year.   Dx with low vit D.   PCP:   [facility]  Patient's last menstrual period was 06/04/2022.     Period Pattern: (!) Irregular Menstrual Flow: Moderate Menstrual Control: Maxi pad Dysmenorrhea: (!) Mild     Sexually active: Yes.    The current method of family planning is condoms.    Exercising: Yes.     Pilates, gym Smoker:  no  Health Maintenance: Pap:  03/16/21 neg:HR HPV neg, 08/26/17 neg History of abnormal Pap:  no MMG:  n/a Colonoscopy: 2021 and had endoscopy with [facility] in [place]. BMD:   n/a  Result  n/a TDaP:  had Tetanus about 1 year ago.  Gardasil:   yes HIV: 10/09/19 NR Hep C: 03/16/21 NR Screening Labs:  PCP   reports that she has never smoked. She has never used smokeless tobacco. She reports that she does not drink alcohol and does not use drugs.  Past Medical History:  Diagnosis Date   Chlamydia 07/2016   Elevated liver function tests 2019   elevated AST, ALT   GERD (gastroesophageal reflux disease) 11/2019   Migraine    without aura    Past Surgical History:  Procedure Laterality Date   CHOLECYSTECTOMY  2022   WISDOM TOOTH EXTRACTION  05/27/2017    No current outpatient medications on file.   No current facility-administered medications for this visit.    Family History  Problem  Relation Age of Onset   Anemia Mother    Thyroid disease Mother    Diabetes Father    Diabetes Maternal Grandmother     Review of Systems  All other systems reviewed and are negative.   Exam:   BP 126/78 (BP Location: Right Arm, Patient Position: Sitting, Cuff Size: Normal)   Pulse 77   Ht 5' 1.5" (1.562 m)   Wt 128 lb (58.1 kg)   LMP 06/04/2022   SpO2 97%   BMI 23.79 kg/m     General appearance: alert, cooperative and appears stated age Head: normocephalic, without obvious abnormality, atraumatic Neck: no adenopathy, supple, symmetrical, trachea midline and thyroid normal to inspection and palpation Lungs: clear to auscultation bilaterally Breasts: normal appearance, no masses or tenderness, No nipple retraction or dimpling, No nipple discharge or bleeding, No axillary adenopathy Heart: regular rate and rhythm Abdomen: soft, non-tender; no masses, no organomegaly Extremities: extremities normal, atraumatic, no cyanosis or edema Skin: skin color, texture, turgor normal. No rashes or lesions Lymph nodes: cervical, supraclavicular, and axillary nodes normal. Neurologic: grossly normal  Pelvic: External genitalia:  no lesions              No abnormal inguinal nodes palpated.  Urethra:  normal appearing urethra with no masses, tenderness or lesions              Bartholins and Skenes: normal                 Vagina: normal appearing vagina with normal color and discharge, no lesions              Cervix: no lesions              Pap taken: no Bimanual Exam:  Uterus:  normal size, contour, position, consistency, mobility, non-tender              Adnexa: no mass, fullness, tenderness            Chaperone was present for exam:  Warren Lacy, CMA  Assessment:   Well woman visit with gynecologic exam. PCOS.   Irregular menses. Increased acne and mild facial hair growth.  Migraine without aura.  Plan: Mammogram screening age 56.  Self breast awareness reviewed. Pap and  HR HPV 2028. Guidelines for Calcium, Vitamin D, regular exercise program including cardiovascular and weight bearing exercise. She will return for lab visit to check testosterone and thryoid function. We discussed possible Yaz Rx. Warning signs of stroke , MI, DVT, PE discussed.  She wants to discuss this possible prescription with her husband.  Follow up annually and prn.   After visit summary provided.

## 2022-07-04 ENCOUNTER — Ambulatory Visit: Payer: Managed Care, Other (non HMO) | Admitting: Obstetrics and Gynecology

## 2022-07-05 ENCOUNTER — Ambulatory Visit: Payer: Managed Care, Other (non HMO) | Admitting: Obstetrics and Gynecology

## 2022-07-05 ENCOUNTER — Encounter: Payer: Self-pay | Admitting: Obstetrics and Gynecology

## 2022-07-05 VITALS — BP 126/78 | HR 77 | Ht 61.5 in | Wt 128.0 lb

## 2022-07-05 DIAGNOSIS — Z01419 Encounter for gynecological examination (general) (routine) without abnormal findings: Secondary | ICD-10-CM

## 2022-07-05 DIAGNOSIS — N926 Irregular menstruation, unspecified: Secondary | ICD-10-CM | POA: Diagnosis not present

## 2022-07-05 NOTE — Patient Instructions (Addendum)
Drospirenone; Ethinyl Estradiol Tablets What is this medication? DROSPIRENONE; ETHINYL ESTRADIOL (dro SPY re nown; ETH in il es tra DYE ole) prevents ovulation and pregnancy. It may also be used to treat acne and premenstrual dysphoric disorder (PMDD). It belongs to a group of medications called oral contraceptives. It is a combination of the hormones estrogen and progestin. This medicine may be used for other purposes; ask your health care provider or pharmacist if you have questions. COMMON BRAND NAME(S): Gianvi, Jasmiel, Lo-Zumandimine, Loryna, Nikki 28-Day, Ocella, Syeda, Vestura, Yasmin, Yaz, Zarah, Zumandimine What should I tell my care team before I take this medication? They need to know if you have or ever had any of these conditions: Abnormal vaginal bleeding Adrenal gland disease Blood vessel disease or blood clots Breast, cervical, endometrial, ovarian, liver, or uterine cancer Diabetes Gallbladder disease Heart disease or recent heart attack High blood pressure High cholesterol High potassium level Kidney disease Liver disease Migraine headaches Stroke Systemic lupus erythematosus (SLE) Tobacco use An unusual or allergic reaction to estrogens, progestins, or other medications, foods, dyes, or preservatives Pregnant or trying to get pregnant Breast-feeding How should I use this medication? Take this medication by mouth. Take it as directed on the prescription label at the same time every day. You can take it with or without food. If it upsets your stomach, take it with food. Keep taking it unless your care team tells you to stop. A patient package insert for the product will be given with each prescription and refill. Be sure to read this information carefully each time. The sheet may change often. Talk to your care team about the use of this medication in children. Special care may be needed. Overdosage: If you think you have taken too much of this medicine contact a poison  control center or emergency room at once. NOTE: This medicine is only for you. Do not share this medicine with others. What if I miss a dose? If you miss a dose, refer to the patient information sheet you received with your medication for direction. If you miss more than one pill, this medication may not be as effective, and you may need to use a back-up contraceptive. What may interact with this medication? Do not take this medication with any of the following: Aminoglutethimide Amprenavir, fosamprenavir Atazanavir; cobicistat Anastrozole Bosentan Exemestane Letrozole Metyrapone Testolactone This medication may also interact with the following: Acetaminophen Antiviral medications for HIV or AIDS Aprepitant Barbiturates Certain antibiotics like rifampin, rifabutin, rifapentine, and possibly penicillins or tetracyclines Certain diuretics like amiloride, spironolactone, triamterene Certain medications for fungal infections like griseofulvin, ketoconazole, itraconazole Certain medications for high blood pressure or heart conditions like ACE-inhibitors, Angiotensin-II receptor blockers, eplerenone Certain medications for seizures like carbamazepine, oxcarbazepine, phenobarbital, phenytoin Cholestyramine Cobicistat Corticosteroid like hydrocortisone and prednisolone Cyclosporine Dantrolene Felbamate Grapefruit juice Heparin Lamotrigine Medications for diabetes, including pioglitazone Modafinil NSAIDs Potassium supplements Pyrimethamine Raloxifene St. John's wort Sulfasalazine Tamoxifen Topiramate Thyroid hormones Warfarin This list may not describe all possible interactions. Give your health care provider a list of all the medicines, herbs, non-prescription drugs, or dietary supplements you use. Also tell them if you smoke, drink alcohol, or use illegal drugs. Some items may interact with your medicine. What should I watch for while using this medication? Visit your care  team for regular checks on your progress. You will need a regular breast and pelvic exam and Pap smear while on this medication. Use an additional method of contraception during the first cycle that you take these   tablets. If you have any reason to think you are pregnant, stop taking this medication right away and contact your care team. If you are taking this medication for hormone related problems, it may take several cycles to see improvement in your condition. Smoking increases the risk of getting a blood clot or having a stroke while you are taking this medication, especially if you are more than 27 years old. You are strongly advised not to smoke. This medication can make your body retain fluid, making your fingers, hands, or ankles swell. Your blood pressure can go up. Contact your care team if you feel you are retaining fluid. This medication can make you more sensitive to the sun. Keep out of the sun. If you cannot avoid being in the sun, wear protective clothing and use sunscreen. Do not use sun lamps or tanning beds/booths. If you wear contact lenses and notice visual changes, or if the lenses begin to feel uncomfortable, consult your eye care specialist. Tenderness, swelling, or minor bleeding of the gums may occur. Notify your dentist if this happens. Brushing and flossing your teeth regularly may help limit this. See your dentist regularly and inform your dentist of the medications you are taking. If you are going to have elective surgery, you may need to stop taking this medication before the surgery. Consult your care team for advice. This medication does not protect you against HIV infection (AIDS) or any other sexually transmitted infections. What side effects may I notice from receiving this medication? Side effects that you should report to your care team as soon as possible: Allergic reactions--skin rash, itching, hives, swelling of the face, lips, tongue, or throat Blood  clot--pain, swelling, or warmth in the leg, shortness of breath, chest pain Gallbladder problems--severe stomach pain, nausea, vomiting, fever Increase in blood pressure Liver injury--right upper belly pain, loss of appetite, nausea, light-colored stool, dark yellow or brown urine, yellowing skin or eyes, unusual weakness, fatigue New or worsening migraines or headaches Stroke--sudden numbness or weakness of the face, arm, or leg, trouble speaking, confusion, trouble walking, loss of balance or coordination, dizziness, severe headache, change in vision Unusual vaginal discharge, itching, or odor Worsening mood, feelings of depression Side effects that usually do not require medical attention (report to your care team if they continue or are bothersome): Breast pain or tenderness Dark patches of skin on the face or other sun-exposed areas Irregular menstrual cycles or spotting Nausea Weight gain This list may not describe all possible side effects. Call your doctor for medical advice about side effects. You may report side effects to FDA at 1-800-FDA-1088. Where should I keep my medication? Keep out of the reach of children and pets. Store at room temperature between 15 and 30 degrees C (59 and 86 degrees F). Throw away any unused medication after the expiration date. NOTE: This sheet is a summary. It may not cover all possible information. If you have questions about this medicine, talk to your doctor, pharmacist, or health care provider.  2023 Elsevier/Gold Standard (2007-04-05 00:00:00)   EXERCISE AND DIET:  We recommended that you start or continue a regular exercise program for good health. Regular exercise means any activity that makes your heart beat faster and makes you sweat.  We recommend exercising at least 30 minutes per day at least 3 days a week, preferably 4 or 5.  We also recommend a diet low in fat and sugar.  Inactivity, poor dietary choices and obesity can cause diabetes,  heart  attack, stroke, and kidney damage, among others.    ALCOHOL AND SMOKING:  Women should limit their alcohol intake to no more than 7 drinks/beers/glasses of wine (combined, not each!) per week. Moderation of alcohol intake to this level decreases your risk of breast cancer and liver damage. And of course, no recreational drugs are part of a healthy lifestyle.  And absolutely no smoking or even second hand smoke. Most people know smoking can cause heart and lung diseases, but did you know it also contributes to weakening of your bones? Aging of your skin?  Yellowing of your teeth and nails?  CALCIUM AND VITAMIN D:  Adequate intake of calcium and Vitamin D are recommended.  The recommendations for exact amounts of these supplements seem to change often, but generally speaking 600 mg of calcium (either carbonate or citrate) and 800 units of Vitamin D per day seems prudent. Certain women may benefit from higher intake of Vitamin D.  If you are among these women, your doctor will have told you during your visit.    PAP SMEARS:  Pap smears, to check for cervical cancer or precancers,  have traditionally been done yearly, although recent scientific advances have shown that most women can have pap smears less often.  However, every woman still should have a physical exam from her gynecologist every year. It will include a breast check, inspection of the vulva and vagina to check for abnormal growths or skin changes, a visual exam of the cervix, and then an exam to evaluate the size and shape of the uterus and ovaries.  And after 27 years of age, a rectal exam is indicated to check for rectal cancers. We will also provide age appropriate advice regarding health maintenance, like when you should have certain vaccines, screening for sexually transmitted diseases, bone density testing, colonoscopy, mammograms, etc.   MAMMOGRAMS:  All women over 37 years old should have a yearly mammogram. Many facilities now offer  a "3D" mammogram, which may cost around $50 extra out of pocket. If possible,  we recommend you accept the option to have the 3D mammogram performed.  It both reduces the number of women who will be called back for extra views which then turn out to be normal, and it is better than the routine mammogram at detecting truly abnormal areas.    COLONOSCOPY:  Colonoscopy to screen for colon cancer is recommended for all women at age 34.  We know, you hate the idea of the prep.  We agree, BUT, having colon cancer and not knowing it is worse!!  Colon cancer so often starts as a polyp that can be seen and removed at colonscopy, which can quite literally save your life!  And if your first colonoscopy is normal and you have no family history of colon cancer, most women don't have to have it again for 10 years.  Once every ten years, you can do something that may end up saving your life, right?  We will be happy to help you get it scheduled when you are ready.  Be sure to check your insurance coverage so you understand how much it will cost.  It may be covered as a preventative service at no cost, but you should check your particular policy.

## 2022-11-15 ENCOUNTER — Ambulatory Visit: Payer: Managed Care, Other (non HMO) | Admitting: Obstetrics and Gynecology

## 2022-11-20 ENCOUNTER — Ambulatory Visit: Payer: Managed Care, Other (non HMO) | Admitting: Obstetrics and Gynecology

## 2022-11-20 ENCOUNTER — Encounter: Payer: Self-pay | Admitting: Obstetrics and Gynecology

## 2022-11-20 ENCOUNTER — Other Ambulatory Visit: Payer: Self-pay | Admitting: *Deleted

## 2022-11-20 VITALS — BP 110/68 | HR 82 | Temp 98.1°F | Wt 139.0 lb

## 2022-11-20 DIAGNOSIS — N760 Acute vaginitis: Secondary | ICD-10-CM

## 2022-11-20 DIAGNOSIS — R102 Pelvic and perineal pain: Secondary | ICD-10-CM

## 2022-11-20 DIAGNOSIS — A609 Anogenital herpesviral infection, unspecified: Secondary | ICD-10-CM | POA: Diagnosis not present

## 2022-11-20 DIAGNOSIS — N898 Other specified noninflammatory disorders of vagina: Secondary | ICD-10-CM

## 2022-11-20 DIAGNOSIS — N926 Irregular menstruation, unspecified: Secondary | ICD-10-CM

## 2022-11-20 MED ORDER — METRONIDAZOLE 500 MG PO TABS
500.0000 mg | ORAL_TABLET | Freq: Two times a day (BID) | ORAL | 0 refills | Status: DC
Start: 2022-11-20 — End: 2023-08-06

## 2022-11-20 MED ORDER — VALACYCLOVIR HCL 1 G PO TABS
1000.0000 mg | ORAL_TABLET | Freq: Three times a day (TID) | ORAL | 0 refills | Status: AC
Start: 2022-11-20 — End: 2022-11-30

## 2022-11-20 NOTE — Patient Instructions (Signed)
Genital Herpes Genital herpes is a common sexually transmitted infection (STI) that is caused by a virus. The virus spreads from person to person through contact with a sore, infected saliva, or infected skin. The virus can cause itching, blisters, and sores around the genitals or rectum. During an outbreak of infection, symptoms may last for several days and then go away. However, the virus remains in the body, so more outbreaks may happen in the future. The time between outbreaks varies and can be from months to years. Genital herpes can affect anyone. It is particularly concerning for pregnant women because the virus can be passed to the baby during delivery. Genital herpes is also a concern for people who have a weak disease-fighting system (immune system). What are the causes? This condition is caused by the herpes simplex virus, type 1 or type 2 (HSV-1 or HSV-2). The virus may spread through: Sexual contact with an infected person, including vaginal, anal, and oral sex. Contact with a herpes sore. The skin. This means that you can get herpes from an infected partner even if there are no blisters or sores present. Your partner may not know that he or she is infected. What increases the risk? You are more likely to develop this condition if: You have sex with many partners. You do not use latex or polyurethane condoms during sex. What are the signs or symptoms? Most people do not have symptoms or they have mild symptoms that may be mistaken for other skin problems. Symptoms may include: Small, red bumps near the genitals, rectum, or mouth. These bumps turn into blisters and then sores. Flu-like (influenza-like) symptoms, including: Fever. Body aches. Swollen lymph nodes. Headache. Painful urination. Pain and itching in the genital area or rectal area. Vaginal discharge. Tingling or shooting pain in the legs and buttocks. Generally, symptoms are more severe and last longer during the first  (primary) outbreak. Influenza-like symptoms are also more common during the primary outbreak. How is this diagnosed? This condition may be diagnosed based on: A physical exam. Your medical history. Blood tests. A test of a fluid sample (culture) from an open sore. How is this treated? There is no cure for this condition, but treatment with antiviral medicines can do the following: Speed up healing and relieve symptoms. Help to reduce the spread of the virus to sexual partners. Limit the chance of future outbreaks, or make future outbreaks shorter. Lessen symptoms of future outbreaks. Your health care provider may also recommend over-the-counter medicines to help with pain and itching. Follow these instructions at home: If you have an outbreak:  Keep the affected areas dry and clean. Avoid rubbing or touching blisters and sores. If you do touch blisters or sores: Wash your hands thoroughly with soap and water for at least 20 seconds. If soap and water are not available, use an alcohol-based hand sanitizer. Do not touch your eyes afterward. Sexual activity Do not have sexual contact during active outbreaks. Practice safe sex. Herpes can spread even if your partner does not have blisters or sores. Latex or polyurethane condoms and female condoms may help prevent the spread of the herpes virus. Managing pain and discomfort If directed, put ice on the painful area. To do this: Put ice in a plastic bag. Place a towel between your skin and the bag. Leave the ice on for 20 minutes, 2-3 times a day. Remove the ice if your skin turns bright red. This is very important. If you cannot feel pain, heat, or  cold, you have a greater risk of damage to the area. If told, take a cool sitz bath to help relieve pain or itching. A sitz bath is a water bath that you take while sitting down in water that is deep enough to cover your hips and buttocks. General instructions Take over-the-counter and  prescription medicines only as told by your health care provider. If you were prescribed an antiviral medicine, use it as told by your health care provider. Do not stop using the antiviral even if you start to feel better. Keep all follow-up visits. This is important. How is this prevented? Use condoms. Although you can get genital herpes during sexual contact even with the use of a condom, a condom can provide some protection. Avoid having multiple sexual partners. Talk with your sexual partner about any symptoms either of you may have. Also, talk with your partner about any history of STIs. Do not have sexual contact if you have active symptoms of genital herpes. Contact a health care provider if: Your symptoms are not improving with medicine. Your symptoms return, or you have new symptoms. You have a fever. You have abdominal pain. You have redness, swelling, or pain in your eye. You notice new sores on other parts of your body. You have had herpes and you become pregnant or plan to become pregnant. Get help right away if: You have symptoms of viral meningitis. This is rare but may happen if the virus spreads to the brain. Symptoms may include: Severe headache or stiff neck. Muscle aches. Nausea and vomiting. Sensitivity to light. Summary Genital herpes is a common sexually transmitted infection (STI) that is caused by the herpes simplex virus, type 1 or type 2 (HSV-1 or HSV-2). These viruses are most often spread through sexual contact with an infected person. You are more likely to develop this condition if you have sex with many partners or you do not use condoms during sex. Most people do not have symptoms or have mild symptoms that may be mistaken for other skin problems. Symptoms occur as outbreaks that may happen months or years apart. There is no cure for this condition, but treatment with oral antiviral medicines can reduce symptoms, reduce the chance of spreading the virus to  a partner, prevent future outbreaks, or shorten future outbreaks. This information is not intended to replace advice given to you by your health care provider. Make sure you discuss any questions you have with your health care provider. Document Revised: 11/17/2020 Document Reviewed: 11/17/2020 Elsevier Patient Education  2024 [employer].

## 2022-11-20 NOTE — Addendum Note (Signed)
Addended by: Eliezer Bottom on: 11/20/2022 01:38 PM   Modules accepted: Orders

## 2022-11-20 NOTE — Progress Notes (Signed)
Patient presents with painful lesion on the right labia a day ago and came for evaluation.  She denies every having this before. No new partners and is married UA with clue cells   Blood pressure 110/68, pulse 82, temperature 98.1 F (36.7 C), temperature source Oral, weight 139 lb (63 kg), last menstrual period 11/14/2022, SpO2 99%.  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  Discussed it can spread through intercourse or oral intercourse as well and to be cautious of outbreaks in the future.  Can do suppression dosing with recurrent episodes and in pregnancy To begin valtrex today.  Counseled to return with worsening or persistent s/s Treatment for bv sent. 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 Karma Greaser

## 2022-11-21 ENCOUNTER — Telehealth: Payer: Self-pay

## 2022-11-21 LAB — SURESWAB® ADVANCED VAGINITIS PLUS,TMA
C. trachomatis RNA, TMA: NOT DETECTED
CANDIDA SPECIES: NOT DETECTED
Candida glabrata: NOT DETECTED
N. gonorrhoeae RNA, TMA: NOT DETECTED
SURESWAB(R) ADV BACTERIAL VAGINOSIS(BV),TMA: POSITIVE — AB
TRICHOMONAS VAGINALIS (TV),TMA: NOT DETECTED

## 2022-11-21 LAB — TSH: TSH: 1.63 mIU/L

## 2022-11-21 LAB — HSV(HERPES SIMPLEX VRS) I + II AB-IGG
HAV 1 IGG,TYPE SPECIFIC AB: <0.9 index
HSV 2 IGG,TYPE SPECIFIC AB: <0.9 index

## 2022-11-21 LAB — HEPATITIS B SURFACE ANTIGEN: Hepatitis B Surface Ag: NONREACTIVE

## 2022-11-21 LAB — RPR: RPR Ser Ql: NONREACTIVE

## 2022-11-21 LAB — HEPATITIS C ANTIBODY: Hepatitis C Ab: NONREACTIVE

## 2022-11-21 LAB — HIV ANTIBODY (ROUTINE TESTING W REFLEX): HIV 1&2 Ab, 4th Generation: NONREACTIVE

## 2022-11-21 NOTE — Telephone Encounter (Signed)
Pt LVM in triage line stating she had question about something current she was having an issue with and if it had anything to do with what she was seen for in our office yesterday?  Spoke w/ pt and she reported that she has a stye in her eye and has been swollen and irritated more recently and wants to know if possibly has anything to do with possible HSV lesion that was seen on exam yesterday and if so, could the valtrex help w/ relief?  Doesn't recall a time when she would have touched lesion and then touched eye at all and also denies receiving any other care for stye at this point. Has had stye in the past and has usually resolved on its own but just wanted to inquire.   Please advise.

## 2022-11-21 NOTE — Telephone Encounter (Signed)
Per EB: "A stye is caused by bacteria caught in a duct.  Warm compression with a rag and antibacterial soap is useful to soak it with. Be careful with the labial lesion and touching your eye. Dr. Karma Greaser"

## 2022-11-21 NOTE — Telephone Encounter (Signed)
Spoke with patient, advised as seen below per Dr. Karma Greaser. Patient verbalizes understanding and is agreeable.   Encounter closed.

## 2022-11-22 LAB — URINE CULTURE
MICRO NUMBER:: 15508383
SPECIMEN QUALITY:: ADEQUATE

## 2022-11-22 LAB — CULTURE INDICATED

## 2022-11-22 LAB — URINALYSIS, COMPLETE W/RFL CULTURE
Bilirubin Urine: NEGATIVE
Glucose, UA: NEGATIVE
Hgb urine dipstick: NEGATIVE
Hyaline Cast: NONE SEEN /LPF
Ketones, ur: NEGATIVE
Nitrites, Initial: NEGATIVE
Protein, ur: NEGATIVE
RBC / HPF: NONE SEEN /HPF (ref 0–2)
Specific Gravity, Urine: 1.01 (ref 1.001–1.035)
pH: 6 (ref 5.0–8.0)

## 2022-11-22 LAB — SURESWAB HSV, TYPE 1/2 DNA, PCR
HSV 1 DNA: DETECTED — AB
HSV 2 DNA: NOT DETECTED

## 2022-11-23 ENCOUNTER — Encounter: Payer: Self-pay | Admitting: *Deleted

## 2022-11-23 LAB — TESTOS,TOTAL,FREE AND SHBG (FEMALE)
Free Testosterone: 3.3 pg/mL (ref 0.1–6.4)
Sex Hormone Binding: 37.1 nmol/L (ref 17–124)
Testosterone, Total, LC-MS-MS: 18 ng/dL (ref 2–45)

## 2022-11-28 ENCOUNTER — Ambulatory Visit: Payer: Managed Care, Other (non HMO) | Admitting: Radiology

## 2022-12-24 ENCOUNTER — Emergency Department (HOSPITAL_BASED_OUTPATIENT_CLINIC_OR_DEPARTMENT_OTHER): Payer: Managed Care, Other (non HMO)

## 2022-12-24 ENCOUNTER — Other Ambulatory Visit: Payer: Self-pay

## 2022-12-24 ENCOUNTER — Emergency Department (HOSPITAL_BASED_OUTPATIENT_CLINIC_OR_DEPARTMENT_OTHER)
Admission: EM | Admit: 2022-12-24 | Discharge: 2022-12-25 | Disposition: A | Discharge status: Home / Self Care | Payer: Managed Care, Other (non HMO) | Attending: Emergency Medicine | Admitting: Emergency Medicine

## 2022-12-24 ENCOUNTER — Encounter (HOSPITAL_BASED_OUTPATIENT_CLINIC_OR_DEPARTMENT_OTHER): Payer: Self-pay

## 2022-12-24 DIAGNOSIS — S161XXA Strain of muscle, fascia and tendon at neck level, initial encounter: Secondary | ICD-10-CM

## 2022-12-24 DIAGNOSIS — S0990XA Unspecified injury of head, initial encounter: Secondary | ICD-10-CM

## 2022-12-24 DIAGNOSIS — M542 Cervicalgia: Secondary | ICD-10-CM | POA: Diagnosis present

## 2022-12-24 DIAGNOSIS — S46811A Strain of other muscles, fascia and tendons at shoulder and upper arm level, right arm, initial encounter: Secondary | ICD-10-CM | POA: Insufficient documentation

## 2022-12-24 DIAGNOSIS — Y92410 Unspecified street and highway as the place of occurrence of the external cause: Secondary | ICD-10-CM | POA: Insufficient documentation

## 2022-12-24 LAB — CBC WITH DIFFERENTIAL/PLATELET
Abs Immature Granulocytes: 0.04 10*3/uL (ref 0.00–0.07)
Basophils Absolute: 0.1 10*3/uL (ref 0.0–0.1)
Basophils Relative: 1 %
Eosinophils Absolute: 0.1 10*3/uL (ref 0.0–0.5)
Eosinophils Relative: 1 %
HCT: 36.7 % (ref 36.0–46.0)
Hemoglobin: 12.9 g/dL (ref 12.0–15.0)
Immature Granulocytes: 1 %
Lymphocytes Relative: 26 %
Lymphs Abs: 2.3 10*3/uL (ref 0.7–4.0)
MCH: 30.7 pg (ref 26.0–34.0)
MCHC: 35.1 g/dL (ref 30.0–36.0)
MCV: 87.4 fL (ref 80.0–100.0)
Monocytes Absolute: 0.5 10*3/uL (ref 0.1–1.0)
Monocytes Relative: 6 %
Neutro Abs: 5.8 10*3/uL (ref 1.7–7.7)
Neutrophils Relative %: 65 %
Platelets: 258 10*3/uL (ref 150–400)
RBC: 4.2 MIL/uL (ref 3.87–5.11)
RDW: 12 % (ref 11.5–15.5)
WBC: 8.7 10*3/uL (ref 4.0–10.5)
nRBC: 0 % (ref 0.0–0.2)

## 2022-12-24 LAB — COMPREHENSIVE METABOLIC PANEL
ALT: 28 U/L (ref 0–44)
AST: 23 U/L (ref 15–41)
Albumin: 4.3 g/dL (ref 3.5–5.0)
Alkaline Phosphatase: 53 U/L (ref 38–126)
Anion gap: 8 (ref 5–15)
BUN: 11 mg/dL (ref 6–20)
CO2: 24 mmol/L (ref 22–32)
Calcium: 9.1 mg/dL (ref 8.9–10.3)
Chloride: 104 mmol/L (ref 98–111)
Creatinine, Ser: 0.41 mg/dL — ABNORMAL LOW (ref 0.44–1.00)
GFR, Estimated: >60 mL/min (ref >60)
Glucose, Bld: 109 mg/dL — ABNORMAL HIGH (ref 70–99)
Potassium: 3.7 mmol/L (ref 3.5–5.1)
Sodium: 136 mmol/L (ref 135–145)
Total Bilirubin: 0.7 mg/dL (ref 0.3–1.2)
Total Protein: 7.7 g/dL (ref 6.5–8.1)

## 2022-12-24 LAB — PREGNANCY, URINE: Preg Test, Ur: NEGATIVE

## 2022-12-24 MED ORDER — ONDANSETRON 4 MG PO TBDP
4.0000 mg | ORAL_TABLET | Freq: Once | ORAL | Status: AC
  Administered 2022-12-24: 4 mg via ORAL
  Filled 2022-12-24: qty 1

## 2022-12-24 MED ORDER — METOCLOPRAMIDE HCL 5 MG/ML IJ SOLN
10.0000 mg | Freq: Once | INTRAMUSCULAR | Status: AC
  Administered 2022-12-24: 10 mg via INTRAVENOUS
  Filled 2022-12-24: qty 2

## 2022-12-24 NOTE — ED Notes (Signed)
ED Provider at bedside. 
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
## 2022-12-24 NOTE — ED Notes (Addendum)
Pt reports she had an episode of vomiting when in the waiting room. She also removed her c-collar during this time. Pt brought back to triage room for re-assessment.

## 2022-12-24 NOTE — ED Notes (Signed)
Patient transported to CT 
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
## 2022-12-24 NOTE — ED Notes (Signed)
Patient seems to be reacting slower than normal per family. Slight cognitive impairment noted on exam. X4 vomiting episodes.

## 2022-12-25 NOTE — Discharge Instructions (Signed)
Take ibuprofen 600 mg every 6 hours as needed for pain.  Rest.  Follow-up with your primary doctor if symptoms are not improving in the next week.
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
## 2023-08-06 ENCOUNTER — Ambulatory Visit (INDEPENDENT_AMBULATORY_CARE_PROVIDER_SITE_OTHER): Payer: Managed Care, Other (non HMO) | Admitting: Obstetrics and Gynecology

## 2023-08-06 ENCOUNTER — Encounter: Payer: Self-pay | Admitting: Obstetrics and Gynecology

## 2023-08-06 VITALS — BP 114/82 | HR 61 | Ht 62.25 in | Wt 141.0 lb

## 2023-08-06 DIAGNOSIS — Z1331 Encounter for screening for depression: Secondary | ICD-10-CM | POA: Diagnosis not present

## 2023-08-06 DIAGNOSIS — N912 Amenorrhea, unspecified: Secondary | ICD-10-CM | POA: Diagnosis not present

## 2023-08-06 DIAGNOSIS — R102 Pelvic and perineal pain: Secondary | ICD-10-CM | POA: Diagnosis not present

## 2023-08-06 DIAGNOSIS — Z01419 Encounter for gynecological examination (general) (routine) without abnormal findings: Secondary | ICD-10-CM | POA: Diagnosis not present

## 2023-08-06 DIAGNOSIS — Z0000 Encounter for general adult medical examination without abnormal findings: Secondary | ICD-10-CM

## 2023-08-06 MED ORDER — DROSPIRENONE-ETHINYL ESTRADIOL 3-0.02 MG PO TABS: 1.0000 | ORAL_TABLET | Freq: Every day | ORAL | 11 refills | Status: AC

## 2023-08-06 MED ORDER — MEDROXYPROGESTERONE ACETATE 10 MG PO TABS: 10.0000 mg | ORAL_TABLET | Freq: Every day | ORAL | 0 refills | Status: AC

## 2023-08-06 NOTE — Progress Notes (Signed)
 28 y.o. G59P0000 Married Hispanic female here for annual exam.  Wants to talk about her PCOS.    No period since October.   Some bilateral lower abdominal tenderness that comes and goes.  No need for medication to control it.   She notes when she gains weight, her period stops.   Has some some random facial hairs.    Not trying for pregnancy for the next 3 years.   Pelvic US  2021 suggestive of PCOS. Prior LH>FSH ratio suggestive of PCOS in 2021. Normal TSH and prolactin then.   Took LoEstrin in the past.   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   Will go to school at [employer] and wants to start dental hygiene program.  PCP: Pcp, No   No LMP recorded.           Sexually active: Yes.    The current method of family planning is condoms sometimes.    Menopausal hormone therapy:  n/a Exercising: Yes.    Weight lifting and walking Smoker:  no  OB History  Gravida Para Term Preterm AB Living  0 0 0 0 0 0  SAB IAB Ectopic Multiple Live Births  0 0 0 0 0     HEALTH MAINTENANCE: Last 2 paps:  03/16/21 neg HR HPV neg, 08/26/17 neg History of abnormal Pap or positive HPV:  no Mammogram:   n/a Colonoscopy:  n/a Bone Density:  n/a  Result  n/a   Immunization History  Administered Date(s) Administered   Hepatitis B, ADULT 02/08/2019   Influenza,inj,Quad PF,6+ Mos 01/10/2019      reports that she has never smoked. She has never used smokeless tobacco. She reports that she does not drink alcohol and does not use drugs.  Past Medical History:  Diagnosis Date   Chlamydia 07/2016   Elevated liver function tests 2019   elevated AST, ALT   Genital HSV    GERD (gastroesophageal reflux disease) 11/2019   HSV-1 infection    Migraine    without aura   PCOS (polycystic ovarian syndrome)     Past Surgical History:  Procedure Laterality Date   CHOLECYSTECTOMY  2022   WISDOM TOOTH EXTRACTION  05/27/2017    Current Outpatient Medications  Medication Sig Dispense Refill    drospirenone-ethinyl estradiol  (YAZ) 3-0.02 MG tablet Take 1 tablet by mouth daily. 28 tablet 11   medroxyPROGESTERone  (PROVERA ) 10 MG tablet Take 1 tablet (10 mg total) by mouth daily. 10 tablet 0   No current facility-administered medications for this visit.    ALLERGIES: Patient has no known allergies.  Family History  Problem Relation Age of Onset   Anemia Mother    Thyroid  disease Mother    Diabetes Father    Diabetes Maternal Grandmother     Review of Systems  All other systems reviewed and are negative.   PHYSICAL EXAM:  BP 114/82 (BP Location: Left Arm, Patient Position: Sitting)   Pulse 61   Ht 5' 2.25" (1.581 m)   Wt 141 lb (64 kg)   SpO2 98%   BMI 25.58 kg/m     General appearance: alert, cooperative and appears stated age Head: normocephalic, without obvious abnormality, atraumatic Neck: no adenopathy, supple, symmetrical, trachea midline and thyroid  normal to inspection and palpation Lungs: clear to auscultation bilaterally Breasts: normal appearance, no masses or tenderness, No nipple retraction or dimpling, No nipple discharge or bleeding, No axillary adenopathy Heart: regular rate and rhythm Abdomen: soft, non-tender; no masses, no organomegaly Extremities: extremities  normal, atraumatic, no cyanosis or edema Skin: skin color, texture, turgor normal. No rashes or lesions Lymph nodes: cervical, supraclavicular, and axillary nodes normal. Neurologic: grossly normal  Pelvic: External genitalia:  no lesions              No abnormal inguinal nodes palpated.              Urethra:  normal appearing urethra with no masses, tenderness or lesions              Bartholins and Skenes: normal                 Vagina: normal appearing vagina with normal color and discharge, no lesions              Cervix: no lesions              Pap taken: no Bimanual Exam:  Uterus:  normal size, contour, position, consistency, mobility, non-tender              Adnexa: no mass,  fullness, tenderness               Chaperone was present for exam:  Cottie Diss, CMA  ASSESSMENT: Well woman visit with gynecologic exam. PCOS.   Amenorrhea.  Mild facial hair growth.  Migraine without aura. Pelvic pain. PHQ-9: 0 HSV 1 of genital region.   PLAN: Mammogram screening discussed. Self breast awareness reviewed. Pap and HRV collected:  no.  Due in 2028. Guidelines for Calcium, Vitamin D, regular exercise program including cardiovascular and weight bearing exercise. Medication refills:  after pregnancy test is back and negative, take Provera  10 mg x 10 days and then start Yaz.   Qual hCG, TSH, prolactin, estradiol , LH, FSH, testosterone, cholesterol, CMP, CBC, A1C. Return for pelvic US . PCOS discussed and effect on reproductive system as well as increased risk of cardiovascular disease.  Patient is working on weight loss.  Follow up:  yearly and prn.

## 2023-08-06 NOTE — Patient Instructions (Signed)

## 2023-08-07 ENCOUNTER — Ambulatory Visit: Payer: Self-pay | Admitting: Obstetrics and Gynecology

## 2023-08-07 DIAGNOSIS — R7401 Elevation of levels of liver transaminase levels: Secondary | ICD-10-CM

## 2023-08-11 LAB — COMPREHENSIVE METABOLIC PANEL WITH GFR
AG Ratio: 1.6 (calc) (ref 1.0–2.5)
ALT: 31 U/L — ABNORMAL HIGH (ref 6–29)
AST: 22 U/L (ref 10–30)
Albumin: 4.9 g/dL (ref 3.6–5.1)
Alkaline phosphatase (APISO): 64 U/L (ref 31–125)
BUN: 13 mg/dL (ref 7–25)
CO2: 26 mmol/L (ref 20–32)
Calcium: 10 mg/dL (ref 8.6–10.2)
Chloride: 105 mmol/L (ref 98–110)
Creat: 0.57 mg/dL (ref 0.50–0.96)
Globulin: 3 g/dL (ref 1.9–3.7)
Glucose, Bld: 87 mg/dL (ref 65–99)
Potassium: 4 mmol/L (ref 3.5–5.3)
Sodium: 139 mmol/L (ref 135–146)
Total Bilirubin: 0.3 mg/dL (ref 0.2–1.2)
Total Protein: 7.9 g/dL (ref 6.1–8.1)
eGFR: 128 mL/min/{1.73_m2} (ref >=60)

## 2023-08-11 LAB — LIPID PANEL
Cholesterol: 226 mg/dL — ABNORMAL HIGH (ref <200)
HDL: 75 mg/dL (ref >=50)
LDL Cholesterol (Calc): 132 mg/dL — ABNORMAL HIGH
Non-HDL Cholesterol (Calc): 151 mg/dL — ABNORMAL HIGH (ref <130)
Total CHOL/HDL Ratio: 3 (calc) (ref <5.0)
Triglycerides: 88 mg/dL (ref <150)

## 2023-08-11 LAB — CBC
HCT: 43.3 % (ref 35.0–45.0)
Hemoglobin: 14.2 g/dL (ref 11.7–15.5)
MCH: 30.6 pg (ref 27.0–33.0)
MCHC: 32.8 g/dL (ref 32.0–36.0)
MCV: 93.3 fL (ref 80.0–100.0)
MPV: 11.7 fL (ref 7.5–12.5)
Platelets: 261 10*3/uL (ref 140–400)
RBC: 4.64 10*6/uL (ref 3.80–5.10)
RDW: 12.4 % (ref 11.0–15.0)
WBC: 5.8 10*3/uL (ref 3.8–10.8)

## 2023-08-11 LAB — HCG, SERUM, QUALITATIVE: Preg, Serum: NEGATIVE

## 2023-08-11 LAB — HEMOGLOBIN A1C
Hgb A1c MFr Bld: 5.5 % (ref <5.7)
Mean Plasma Glucose: 111 mg/dL
eAG (mmol/L): 6.2 mmol/L

## 2023-08-11 LAB — FSH/LH
FSH: 5.6 m[IU]/mL
LH: 13 m[IU]/mL

## 2023-08-11 LAB — TSH: TSH: 2.72 m[IU]/L

## 2023-08-11 LAB — TESTOS,TOTAL,FREE AND SHBG (FEMALE)
Free Testosterone: 3.8 pg/mL (ref 0.1–6.4)
Sex Hormone Binding: 38 nmol/L (ref 17–124)
Testosterone, Total, LC-MS-MS: 36 ng/dL (ref 2–45)

## 2023-08-11 LAB — ESTRADIOL: Estradiol: 133 pg/mL

## 2023-08-11 LAB — PROLACTIN: Prolactin: 9.7 ng/mL

## 2023-08-27 NOTE — Progress Notes (Unsigned)
 GYNECOLOGY  VISIT   HPI: 28 y.o.   Married  Hispanic female   G0P0000 with Patient's last menstrual period was 08/18/2023 (exact date).   here for: u/s consult to check for PCOS.   No menses since October. She had gained weight prior to this.  Had labs to check for PCOS.  LH 13 and FSH 5.6 on 08/06/23.  Normal testosterone.  Normal prolactin, TSH, estradiol .  She had a spontaneous menstrual cycle 08/18/23.  She has been working out and eating cleaner and lost 8 pounds.  Did not take Provera  or Yaz.   Would welcome pregnancy.   GYNECOLOGIC HISTORY: Patient's last menstrual period was 08/18/2023 (exact date). Contraception:  Condoms sometimes  Menopausal hormone therapy:  n/a Last 2 paps:  03/16/21 neg HR HPV neg, 08/26/17 neg  History of abnormal Pap or positive HPV:  no Mammogram:  n/a        OB History     Gravida  0   Para  0   Term  0   Preterm  0   AB  0   Living  0      SAB  0   IAB  0   Ectopic  0   Multiple  0   Live Births  0              Patient Active Problem List   Diagnosis Date Noted   Irregular menses 08/24/2016    Past Medical History:  Diagnosis Date   Chlamydia 07/2016   Elevated liver function tests 2019   elevated AST, ALT   Genital HSV    GERD (gastroesophageal reflux disease) 11/2019   HSV-1 infection    Migraine    without aura   PCOS (polycystic ovarian syndrome)     Past Surgical History:  Procedure Laterality Date   CHOLECYSTECTOMY  2022   WISDOM TOOTH EXTRACTION  05/27/2017    Current Outpatient Medications  Medication Sig Dispense Refill   drospirenone -ethinyl estradiol  (YAZ) 3-0.02 MG tablet Take 1 tablet by mouth daily. (Patient not taking: Reported on 08/28/2023) 28 tablet 11   medroxyPROGESTERone  (PROVERA ) 10 MG tablet Take 1 tablet (10 mg total) by mouth daily. (Patient not taking: Reported on 08/28/2023) 10 tablet 0   No current facility-administered medications for this visit.     ALLERGIES:  Patient has no known allergies.  Family History  Problem Relation Age of Onset   Anemia Mother    Thyroid  disease Mother    Diabetes Father    Diabetes Maternal Grandmother     Social History   Socioeconomic History   Marital status: Married    Spouse name: Not on file   Number of children: Not on file   Years of education: Not on file   Highest education level: Not on file  Occupational History   Not on file  Tobacco Use   Smoking status: Never   Smokeless tobacco: Never  Vaping Use   Vaping status: Never Used  Substance and Sexual Activity   Alcohol use: No   Drug use: No   Sexual activity: Yes    Birth control/protection: Condom    Comment: condoms sometimes  Other Topics Concern   Not on file  Social History Narrative   Not on file   Social Drivers of Health   Financial Resource Strain: Not on file  Food Insecurity: No Food Insecurity (09/27/2020)   Received from [facility]   Hunger Vital Sign    Within  the past 12 months, you worried that your food would run out before you got the money to buy more.: Never true    Within the past 12 months, the food you bought just didn't last and you didn't have money to get more.: Never true  Transportation Needs: Not on file  Physical Activity: Not on file  Stress: No Stress Concern Present (11/07/2020)   Received from [facility] - Occupational Stress Questionnaire    Feeling of Stress : Not at all  Social Connections: Unknown (07/11/2021)   Received from [facility]   Social Network    Social Network: Not on file  Intimate Partner Violence: Unknown (06/02/2021)   Received from [facility]   HITS    Physically Hurt: Not on file    Insult or Talk Down To: Not on file    Threaten Physical Harm: Not on file    Scream or Curse: Not on file    Review of Systems  All other systems reviewed and are negative.   PHYSICAL EXAMINATION:   BP 112/74 (BP Location: Left Arm,  Patient Position: Sitting)   Pulse 80   LMP 08/18/2023 (Exact Date)   SpO2 97%     General appearance: alert, cooperative and appears stated age   Pelvic US  Uterus 6.8 x 3.82 x 3.2 cm.  No fibroids.  EMS 4.15 mm.  Left ovary 2.81 x 1.56 x 1.81 cm. Normal follicle pattern.  Right ovary 2.95 x 1.79 x 1.94 cm.  Normal follicle pattern.  No adnexal masses.  No free fluid.   ASSESSMENT:  Oligomenorrhea.  Spontaneous cycle following weight loss.  Elevated ALT.  Desire for future pregnancy.   PLAN:  Pelvic US  images and report reviewed.  Start PNV. Avoid exposures including a cat litter box.  Preconception counseling provided and reading suggested:  What to Expect Before You're Expecting She will talk with her husband about their fertility plans and make a decision if she wants to take Yaz or do monthly Provera  5 mg x 5 days to bring on a menstrual period each month.  If does monthly Provera , she will do a UPT if she goes 35 days without a cycle.  If negative UPT, then take Provera  5 mg x 5 days. Will check LFTs and Rubella titer. FU prn.   30 min  total time was spent for this patient encounter, including preparation, face-to-face counseling with the patient, coordination of care, and documentation of the encounter.

## 2023-08-28 ENCOUNTER — Ambulatory Visit (INDEPENDENT_AMBULATORY_CARE_PROVIDER_SITE_OTHER): Admitting: Obstetrics and Gynecology

## 2023-08-28 ENCOUNTER — Ambulatory Visit (INDEPENDENT_AMBULATORY_CARE_PROVIDER_SITE_OTHER)

## 2023-08-28 ENCOUNTER — Encounter: Payer: Self-pay | Admitting: Obstetrics and Gynecology

## 2023-08-28 VITALS — BP 112/74 | HR 80

## 2023-08-28 DIAGNOSIS — R7401 Elevation of levels of liver transaminase levels: Secondary | ICD-10-CM | POA: Diagnosis not present

## 2023-08-28 DIAGNOSIS — N914 Secondary oligomenorrhea: Secondary | ICD-10-CM | POA: Diagnosis not present

## 2023-08-28 DIAGNOSIS — Z3169 Encounter for other general counseling and advice on procreation: Secondary | ICD-10-CM

## 2023-08-28 DIAGNOSIS — R102 Pelvic and perineal pain: Secondary | ICD-10-CM

## 2023-08-28 NOTE — Patient Instructions (Addendum)
 Check out the book What to Expect Before You're Expecting.

## 2023-08-29 LAB — RUBELLA SCREEN: Rubella: 1.37 {index}

## 2023-09-02 ENCOUNTER — Ambulatory Visit: Payer: Self-pay | Admitting: Obstetrics and Gynecology

## 2023-09-03 NOTE — Telephone Encounter (Signed)
 Per Nichole lab tech, the hepatic funtion panel could not be added. Patient is aware. She states she needs to look at her work schedule & will call back to schedule.

## 2023-10-05 ENCOUNTER — Telehealth: Payer: Self-pay | Admitting: Obstetrics and Gynecology

## 2023-10-05 NOTE — Telephone Encounter (Signed)
 Please remind patient that she is due for a recheck of her liver function studies with a lab visit.  She came up in my reminder box in Epic.  She has an elevated ALT level.   A future order has already been placed.

## 2023-10-07 NOTE — Telephone Encounter (Signed)
 Encounter reviewed and closed.

## 2023-10-07 NOTE — Telephone Encounter (Signed)
 Patient notified. Lab appt scheduled for this Thursday 10-10-23 at 2:40 pm

## 2023-10-10 ENCOUNTER — Other Ambulatory Visit

## 2023-10-10 DIAGNOSIS — R7401 Elevation of levels of liver transaminase levels: Secondary | ICD-10-CM

## 2023-10-11 ENCOUNTER — Ambulatory Visit: Payer: Self-pay | Admitting: Obstetrics and Gynecology

## 2023-10-11 LAB — HEPATIC FUNCTION PANEL
AG Ratio: 1.7 (calc) (ref 1.0–2.5)
ALT: 34 U/L — ABNORMAL HIGH (ref 6–29)
AST: 24 U/L (ref 10–30)
Albumin: 4.4 g/dL (ref 3.6–5.1)
Alkaline phosphatase (APISO): 56 U/L (ref 31–125)
Bilirubin, Direct: 0 mg/dL (ref 0.0–0.2)
Globulin: 2.6 g/dL (ref 1.9–3.7)
Indirect Bilirubin: 0.3 mg/dL (ref 0.2–1.2)
Total Bilirubin: 0.3 mg/dL (ref 0.2–1.2)
Total Protein: 7 g/dL (ref 6.1–8.1)

## 2023-10-24 NOTE — Telephone Encounter (Signed)
 Spoke with pt she states that she is currently going to [facility] for her primary care. She states that she did have them check her ALT again, I asked her to have them fax us  those results and If they needed any of our office notes they just need to be requested. She states that she has been thinking about changing to a [facility] provider. I did let her know that she can go on to the [facility] primary care site, search a location that it in her area and call to make an appt or make a NP appt on mychart.  She expressed her understanding.

## 2023-10-24 NOTE — Telephone Encounter (Signed)
 Patient called back & left message on triage voicemail stating that she needs a referral.

## 2024-08-12 ENCOUNTER — Ambulatory Visit: Admitting: Obstetrics and Gynecology
